# Patient Record
Sex: Female | Born: 1955 | Race: White | Hispanic: No | Marital: Married | Smoking: Never smoker
Health system: Southern US, Academic
[De-identification: ages and names within clinical notes are randomized; demographics above are authoritative.]

## PROBLEM LIST (undated history)

## (undated) DIAGNOSIS — E119 Type 2 diabetes mellitus without complications: Secondary | ICD-10-CM

## (undated) DIAGNOSIS — Z1371 Encounter for nonprocreative screening for genetic disease carrier status: Secondary | ICD-10-CM

## (undated) DIAGNOSIS — J449 Chronic obstructive pulmonary disease, unspecified: Secondary | ICD-10-CM

## (undated) DIAGNOSIS — M461 Sacroiliitis, not elsewhere classified: Secondary | ICD-10-CM

## (undated) DIAGNOSIS — G5700 Lesion of sciatic nerve, unspecified lower limb: Secondary | ICD-10-CM

## (undated) DIAGNOSIS — I10 Essential (primary) hypertension: Secondary | ICD-10-CM

## (undated) DIAGNOSIS — E039 Hypothyroidism, unspecified: Secondary | ICD-10-CM

## (undated) DIAGNOSIS — M1990 Unspecified osteoarthritis, unspecified site: Secondary | ICD-10-CM

## (undated) DIAGNOSIS — K760 Fatty (change of) liver, not elsewhere classified: Secondary | ICD-10-CM

## (undated) DIAGNOSIS — E785 Hyperlipidemia, unspecified: Secondary | ICD-10-CM

## (undated) HISTORY — DX: Type 2 diabetes mellitus without complications: E11.9

## (undated) HISTORY — DX: Unspecified osteoarthritis, unspecified site: M19.90

## (undated) HISTORY — DX: Essential (primary) hypertension: I10

## (undated) HISTORY — DX: Hypothyroidism, unspecified: E03.9

## (undated) HISTORY — PX: HX BREAST LUMPECTOMY: SHX2

## (undated) HISTORY — DX: Hyperlipidemia, unspecified: E78.5

## (undated) HISTORY — DX: Fatty (change of) liver, not elsewhere classified: K76.0

## (undated) HISTORY — DX: Chronic obstructive pulmonary disease, unspecified: J44.9

## (undated) HISTORY — PX: HX GASTRIC SLEEVE: 2100003104

---

## 1992-05-29 ENCOUNTER — Other Ambulatory Visit (HOSPITAL_COMMUNITY): Payer: Self-pay

## 2015-10-07 ENCOUNTER — Other Ambulatory Visit (HOSPITAL_COMMUNITY): Payer: Self-pay

## 2015-10-07 DIAGNOSIS — R413 Other amnesia: Secondary | ICD-10-CM

## 2015-11-07 ENCOUNTER — Encounter (HOSPITAL_COMMUNITY): Payer: Self-pay

## 2015-11-08 ENCOUNTER — Ambulatory Visit
Admission: RE | Admit: 2015-11-08 | Discharge: 2015-11-08 | Disposition: A | Discharge status: Home / Self Care | Payer: No Typology Code available for payment source | Source: Ambulatory Visit

## 2015-11-08 DIAGNOSIS — R41844 Frontal lobe and executive function deficit: Secondary | ICD-10-CM

## 2015-11-08 DIAGNOSIS — F329 Major depressive disorder, single episode, unspecified: Secondary | ICD-10-CM

## 2015-11-08 DIAGNOSIS — R413 Other amnesia: Secondary | ICD-10-CM | POA: Insufficient documentation

## 2015-11-08 DIAGNOSIS — F419 Anxiety disorder, unspecified: Secondary | ICD-10-CM

## 2015-11-11 NOTE — Progress Notes (Signed)
Department of Behavioral Medicine and Psychiatry  Outpatient Services  [place], [place] [address]      NEUROPSYCHOLOGICAL EVALUATION    PATIENT NAME: Jasmine Bates, ZAHLER  CHART NUMBER: [id number]  DATE OF BIRTH: 06-20-1956  DATE OF SERVICE: 11/08/2015    TIME IN AND OUT:  8:14 a.m./10:52 a.m.    CPT CODES:  [id number] = 3 hours (9:28 a.m./10:14 a.m. interview and evaluation, +2 hours record review, integration, report)  [phone] = 2 hours (Technician:  CRF, 8:14 a.m./9:58 a.m.; 10:14 a.m./10:52 a.m.)    November 08, 2015      Samella Parr, DO   [place], [place] [address]      Dear Dr. Nathaneil Canary:    As you requested, we saw this 60 year old, right-handed, Caucasian woman for a neuropsychological evaluation to clarify her current cognitive functioning.  The patient has noticed cognitive difficulties for at least 3 years that have been progressive in nature.  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  She notices some difficulties at home as well.  Her husband is helping her keep track of medical appointments and medications.      The patient reported some depression at this time that is complicated by grief of her father's recent passing.  She is being treated with Zoloft from her PCP.  An increased dosage has improved her sleep.  She denied suicidal ideation.  She reported longstanding anxiety with rare panic attacks in the past.  Psychological treatment history is fairly unremarkable.  Substance use history is unremarkable.      Medical history is remarkable for migraines, hypertension, hypothyroidism, high cholesterol, and arthritis.  She also has asthma and was intubated approximately 9 times, most recently 15 years ago.  Her asthma  improve with menopause.  She did not notice any longstanding changes with in her memory following any intubations.  Family medical history is remarkable for a sister with multiple sclerosis.  Her last brain MRI in 2012 showed scattered white matter hyperintensities comparable to the prior evaluations in 2010 and 2009.  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  She lives with her husband at this time.    BEHAVIORAL OBSERVATIONS:  The patient was pleasant and cooperative with the evaluation.  Affect was quite anxious.  She was a good historian.  She was noted to get quite flustered when she perceived that she was making mistakes.  Results are valid.    RESULTS:  Brief motor examination revealed intact strength of grip and no pronator drift.  Finger-to-nose was intact.  Rapid alternating hand and finger movements were intact.  Motor sequencing was intact.  Go/no-go was mildly stimulus bound and effortful bilaterally.  She had a mild glabellar tap reflex, but no grasp reflex.  There was utilization bilaterally, left greater than right, but no cogwheeling or rigidity.  Repetitive drawings were somewhat perseverative.  She had 1 bilateral tactile extinction with priming.  Left/right attention was intact.  Praxis was intact.  Visual fields were intact without extinctions.  Gaze was intact.  There was no hemispatial inattention with line bisection.  Basic visuospatial construction was intact.  Complex visuospatial construction was mildly disorganized but generally intact.  Block construction was low average.  Spontaneous speech was fluent and articulate without evidence of word-finding difficulties or paraphasic errors.  Complex comprehension was 3-step midline.  Repetition was intact.  Single word reading was average.  Phonemic fluency was mildly impaired as was semantic fluency.   Confrontational naming was intact.  General cognitive efficiency was mildly impaired and she had slightly more difficulty when behavioral inhibition was required.  Visual scanning and motor response speed had one error and overall speed was average.  When an element of cognitive flexibility was required, performance was average.  Basic auditory attention span was slightly variable and average overall.  Attention in the presence of interference was generally intact, although mildly perseverative.  Verbal learning through repetition revealed a minimally progressive and variable learning curve with low average recall following a delay with encoding and retrieval-based weaknesses.  Memory for logically organized paragraph length information was average.  Basic visuospatial memory was mildly impaired with encoding and retrieval-based deficits.  Verbal abstraction was average.  Visual problem solving was generally intact.  On several self-report questionnaires, she indicated a severe level of depression and anxiety.    IMPRESSIONS:  On this evaluation, the patient demonstrated mild frontal subcortical deficits including deficits in executive functioning (stimulus-bound behavior, perseverative, impulsive), processing speed, visuospatial construction, verbal fluency and encoding and retrieval-based memory.  Her memory benefits from structure and cues.      The patient is experiencing a severe level of depression and anxiety at this time that is likely significantly contributing to her cognitive deficits.  She would likely see improvement in her cognitive functioning with improvement in her depression and anxiety.  Therefore, consultation with a psychiatrist may be beneficial as well as engaging in talk therapy to increase coping.  The patient appears to get frustrated and flustered with mistakes, which likely leads to increased anxiety and more mistakes, creating a cycle that is difficult for her to break.  Talk therapy  could to be helpful in breaking this cycle and reducing automatic negative thoughts.      The patient also has a complex medical history involving asthma with intubation, cardiovascular risk factors and a history of migraines.  White matter changes are notable on her brain MRI.  It is possible that these white matter changes could be contributing to some cognitive inefficiencies as well.  She is experiencing chronic pain, which could also be contributory.  It may be beneficial to update brain imaging to see if white matter hyperintensities have increased over time.      In general, the patient would benefit from structure, routine, limiting multitasking, and allowing herself enough time to complete activities.      Thank you for requesting evaluation.  Do not hesitate to contact us if you require further assistance.    Sincerely,      Blenda Peals, PhD  Assistant Professor  [facility] Department of Behavioral Medicine and Psychiatry    [id number]; D: 11/08/2015 16:11:26; T: 11/11/2015 12:35:16    cc: Julieta Gutting DO      [address]       [place], [place] [address]

## 2015-11-19 ENCOUNTER — Telehealth (HOSPITAL_COMMUNITY): Payer: Self-pay | Admitting: Clinical Neuropsychologist

## 2015-11-19 NOTE — Progress Notes (Signed)
Pt called asking about results of eval.  Pt was receptive to impressions and recommendations.  She was encouraged to call back if she needed assistance finding a psychiatrist or talk therapist in her area.    Blenda Peals, PhD  Licensed Psychologist   Assistant Professor

## 2016-08-09 ENCOUNTER — Other Ambulatory Visit: Payer: Self-pay

## 2018-03-30 IMAGING — US ABD LIMITED
1 series · 14 of 25 positions shown · non-contrast
Comparison: 04/03/2015.

EXAM:  DEDRICK PROFESSIONAL READ ABD U/S LMTD
INDICATION: Fatty liver.

[Series 1: abd limited · 14 of 65 slices shown]
[im 1/65]
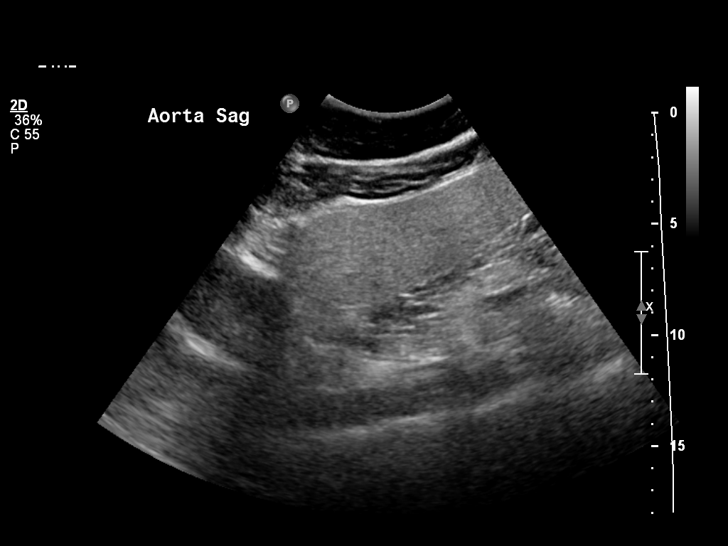
[im 6/65]
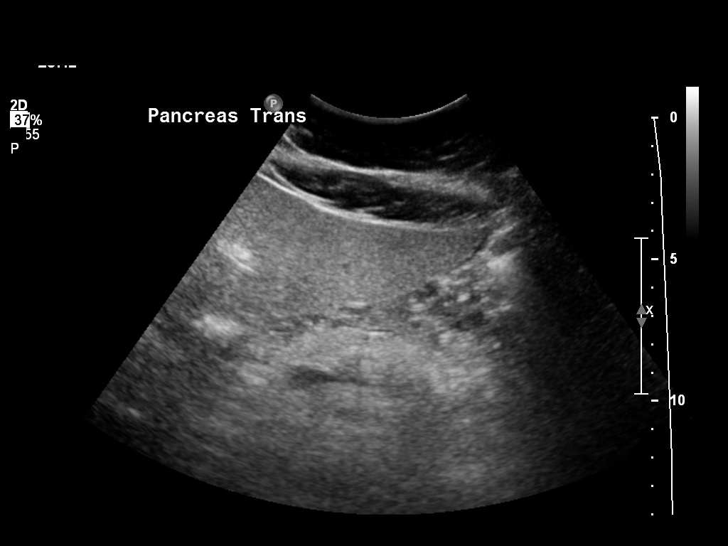
[im 11/65]
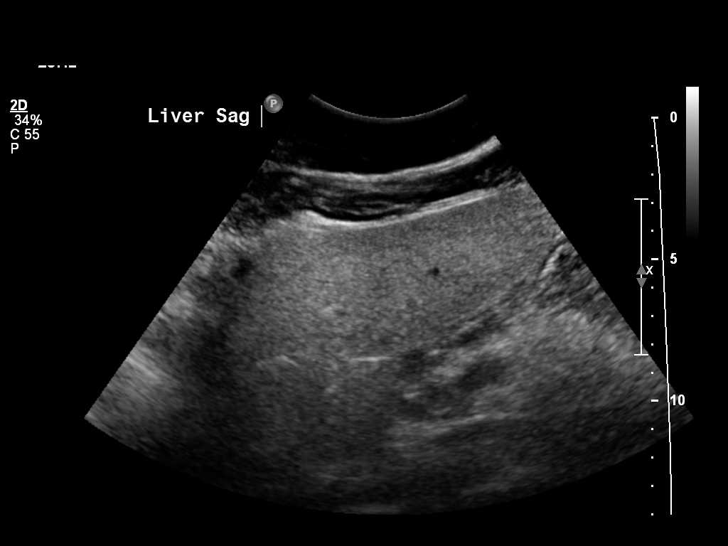
[im 17/65]
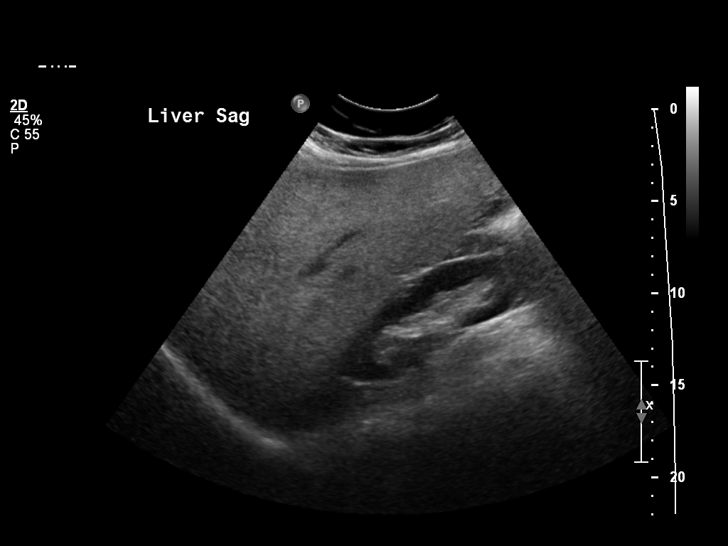
[im 22/65]
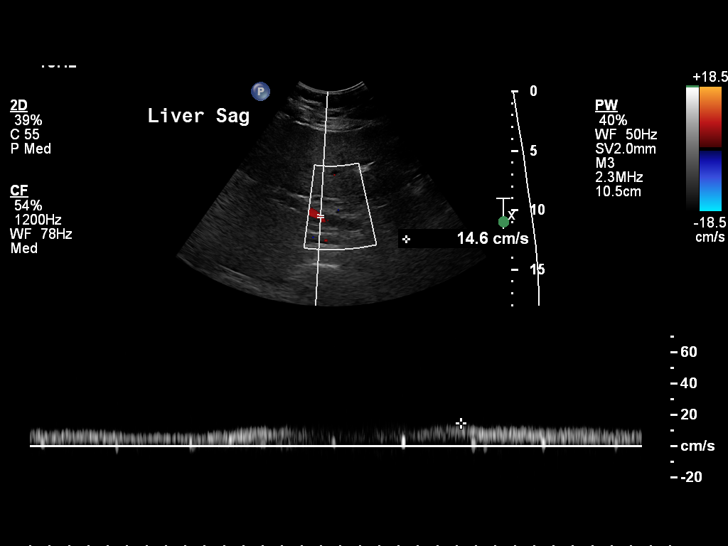
[im 25/65]
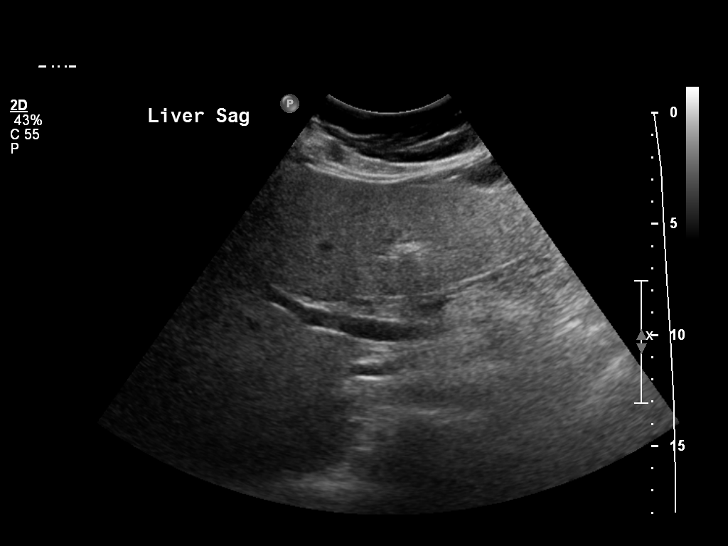
[im 30/65]
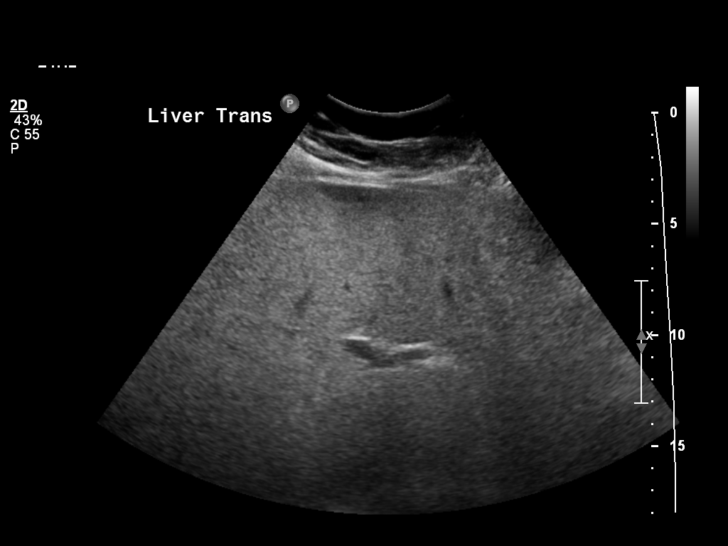
[im 35/65]
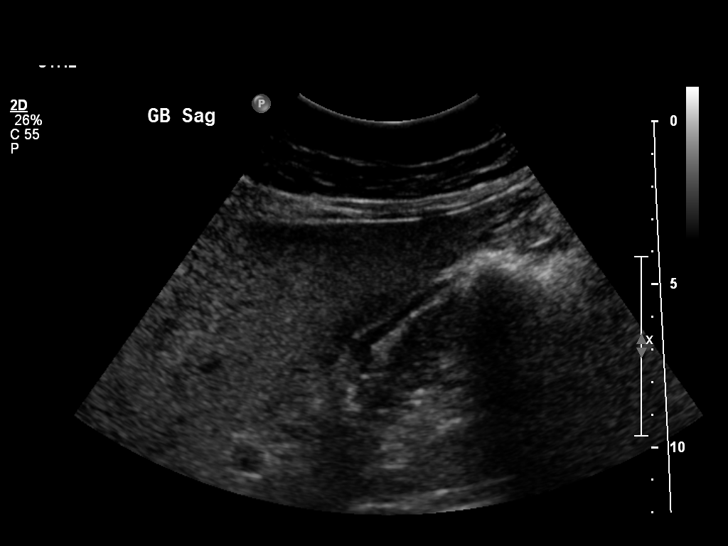
[im 41/65]
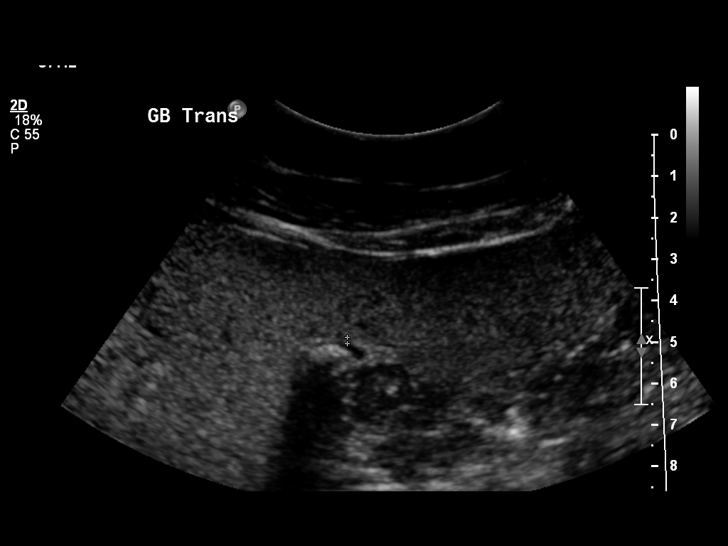
[im 43/65]
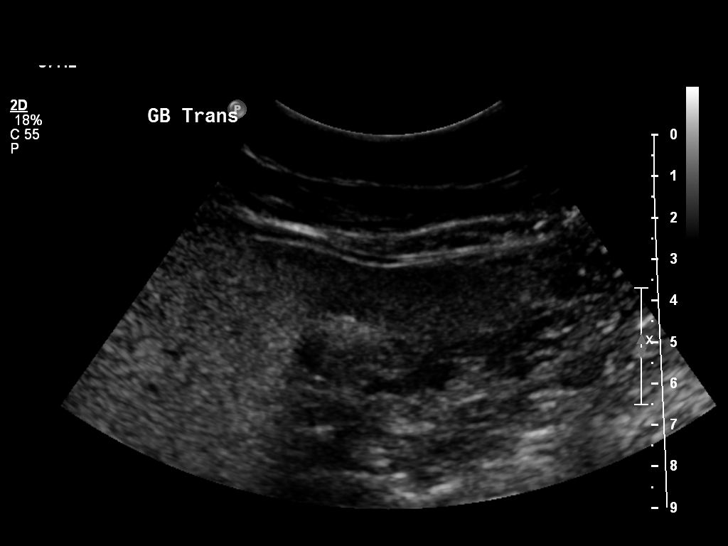
[im 49/65]
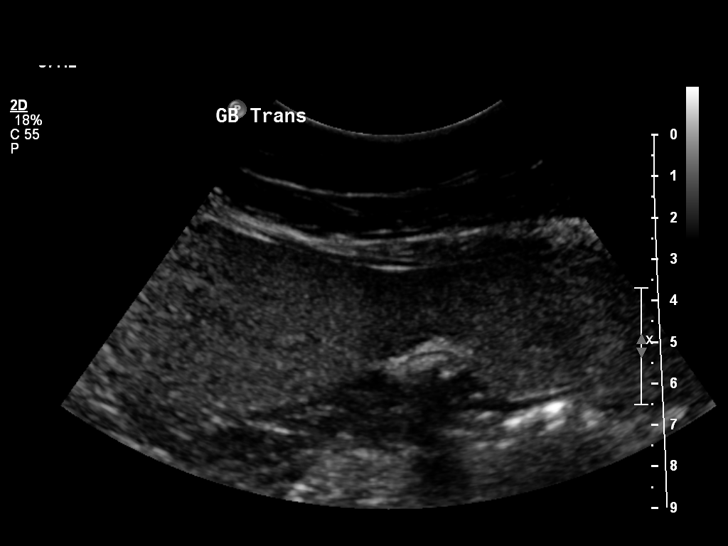
[im 54/65]
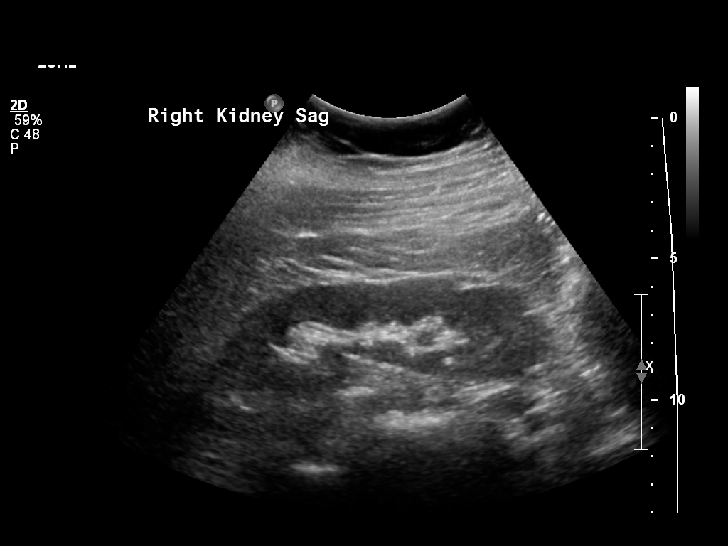
[im 59/65]
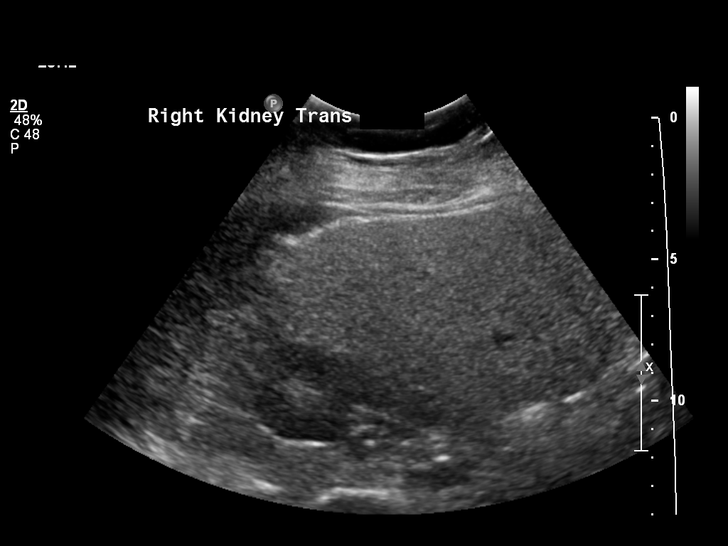
[im 65/65]
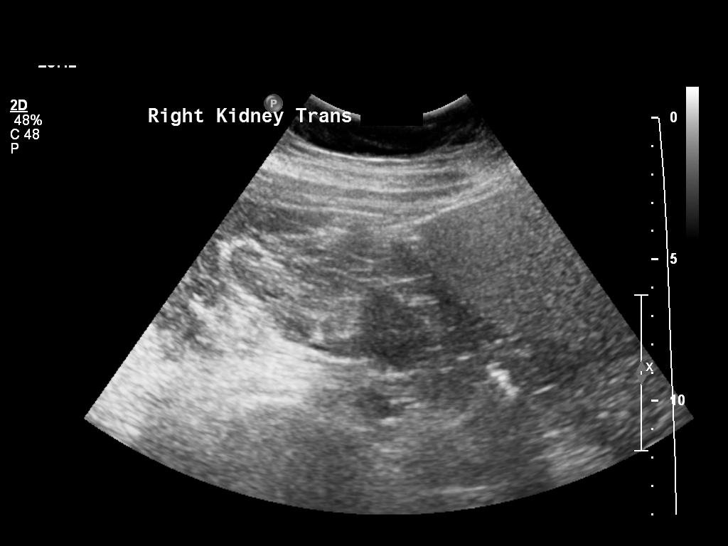

[14 of 25 positions shown; findings below may reference images not displayed]

FINDINGS: Liver is fatty and enlarged measuring 20.5 cm in maximum sagittal dimension. Fatty infiltration limits evaluation for focal hepatic mass. There is no intra or extrahepatic biliary ductal dilatation. Common bile duct measures 3.5 mm. Gallbladder is contracted. There is a shadowing calculus in the region of the gallbladder fundus. There is no pericholecystic fluid. Pancreas is incompletely visualized due to artifact from overlying bowel gas. Right kidney measures 12 cm and is normal.

Visualized abdominal aorta is without aneurysmal dilatation. IVC is normal. Portal vein measures 7 mm in diameter and demonstrates hepatopetal flow. Hepatic veins are also patent. There is no ascites.
IMPRESSION: 1. Fatty and enlarged liver. 

2. Cholelithiasis without sonographic evidence of acute cholecystitis. 

3. Pancreas incompletely visualized due to artifact from overlying bowel gas.

## 2018-08-03 DIAGNOSIS — C50919 Malignant neoplasm of unspecified site of unspecified female breast: Secondary | ICD-10-CM

## 2018-08-03 HISTORY — DX: Malignant neoplasm of unspecified site of unspecified female breast: C50.919

## 2019-06-04 DEATH — deceased

## 2020-05-06 IMAGING — US ABD LIMITED
1 series · 14 of 25 positions shown · non-contrast
Comparison: 03/30/2018.

﻿EXAM:  LOLI PROFESSIONAL READ ABD U/S LMTD
INDICATION: Fatty liver.

[Series 1: abd limited · 14 of 58 slices shown]
[im 1/58]
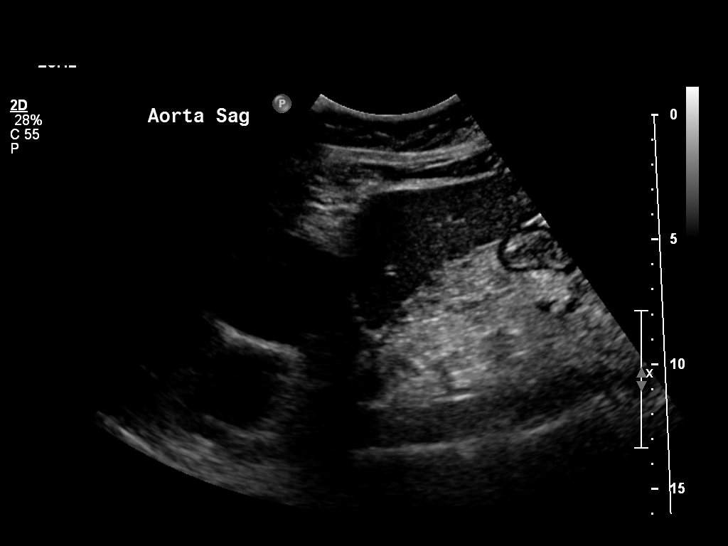
[im 5/58]
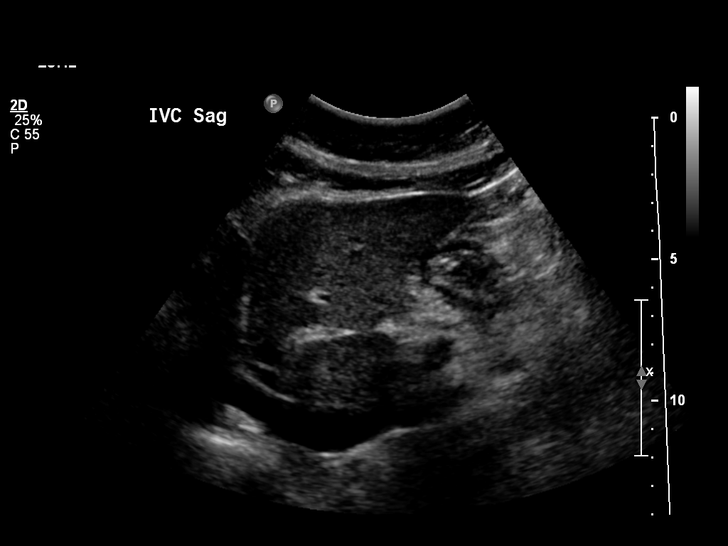
[im 10/58]
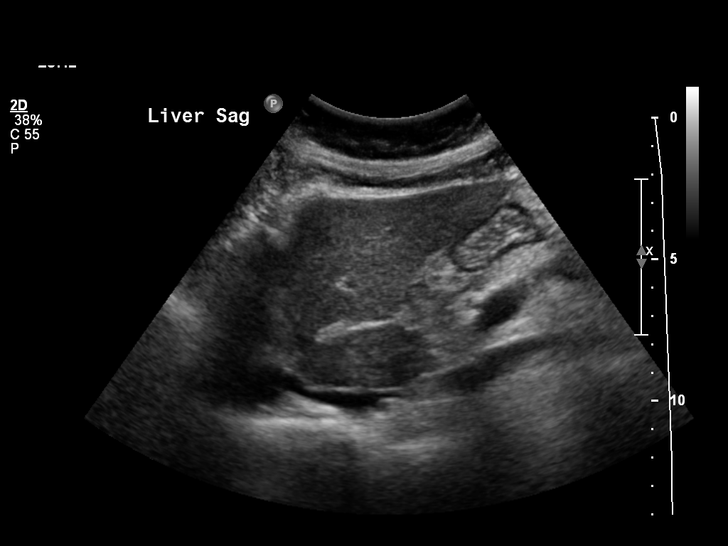
[im 15/58]
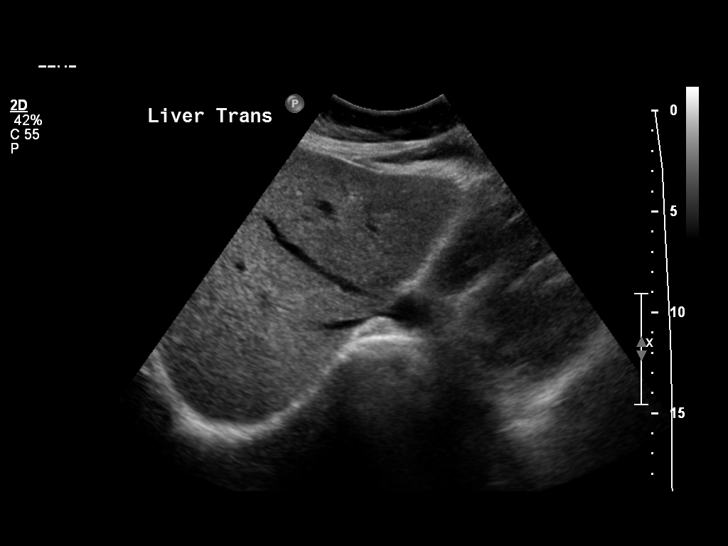
[im 20/58]
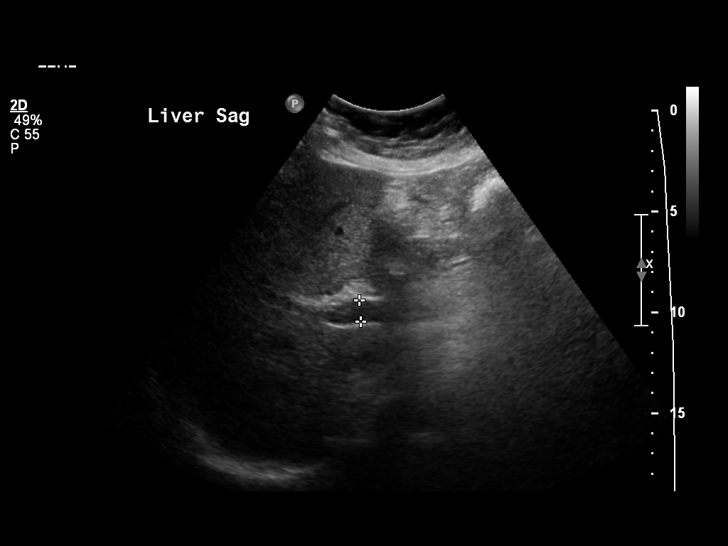
[im 22/58]
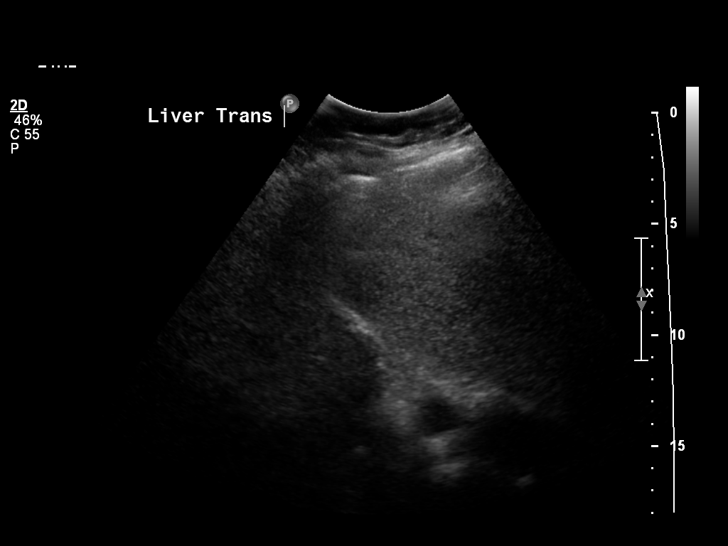
[im 27/58]
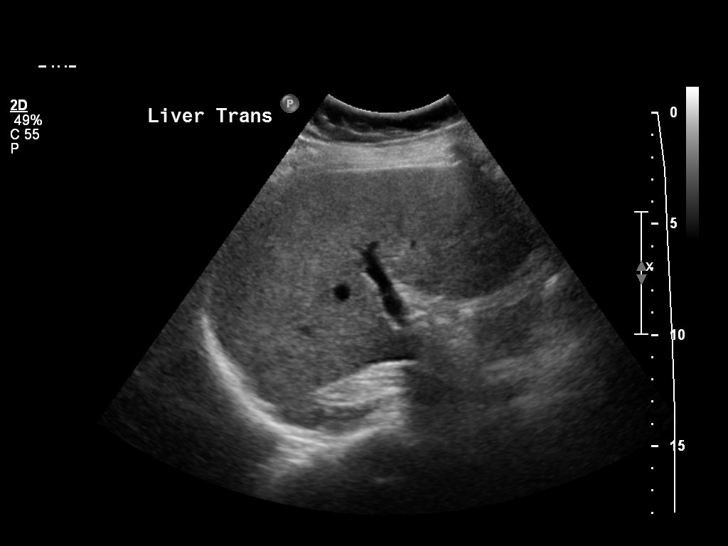
[im 31/58]
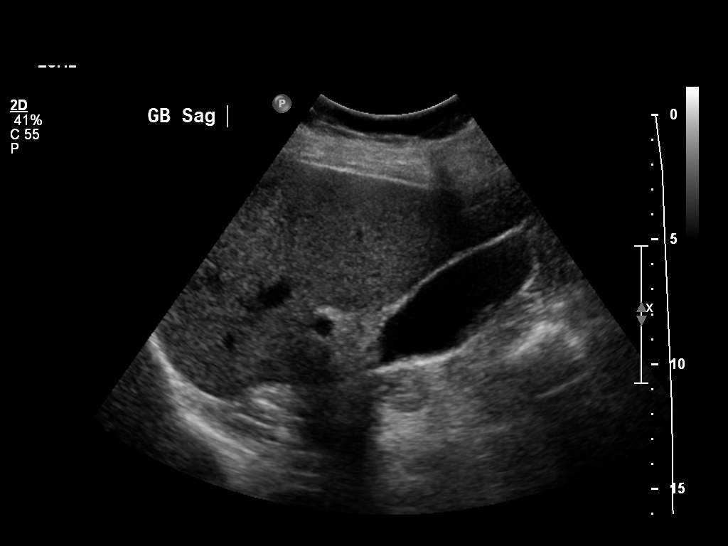
[im 36/58]
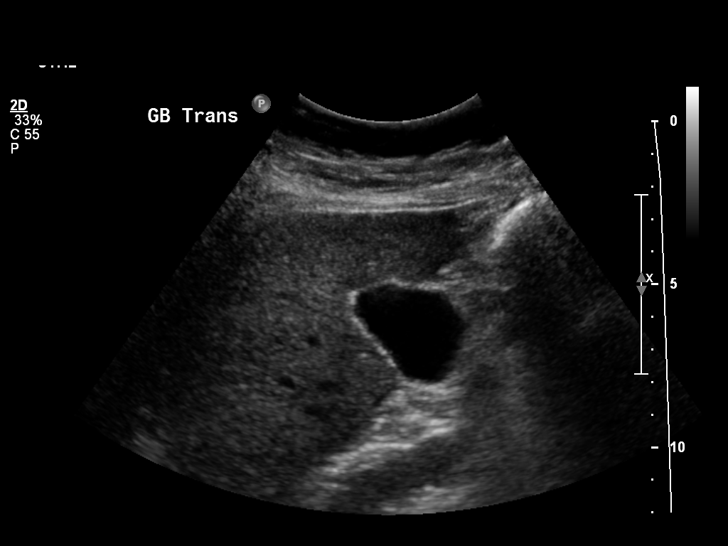
[im 39/58]
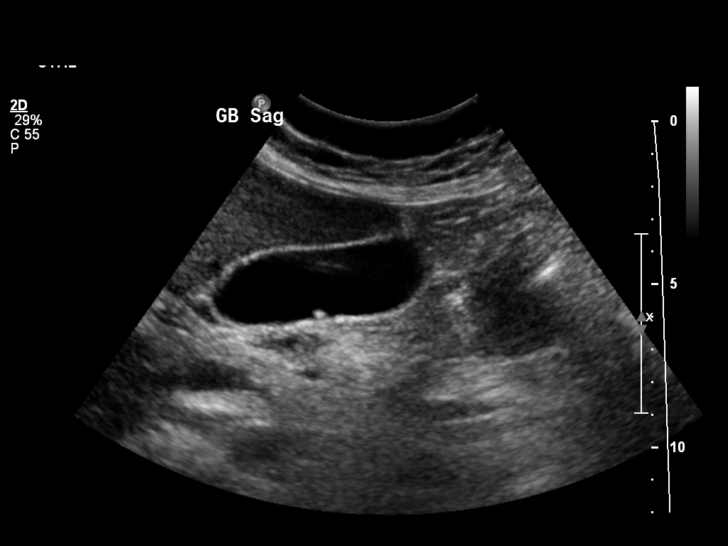
[im 43/58]
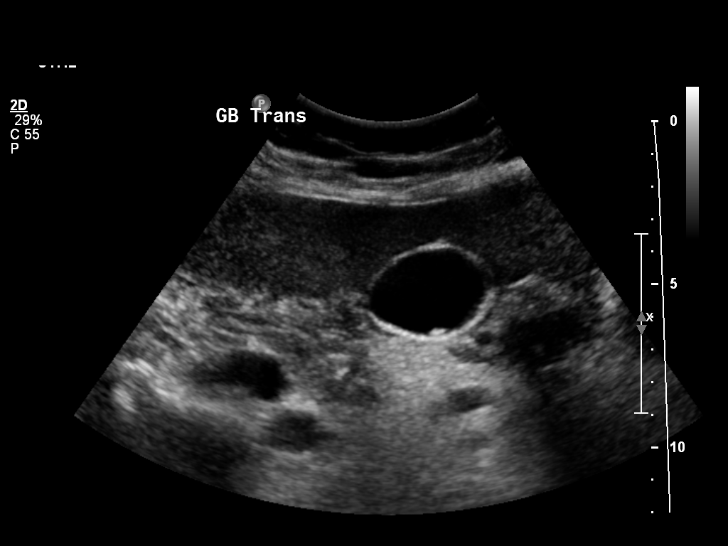
[im 48/58]
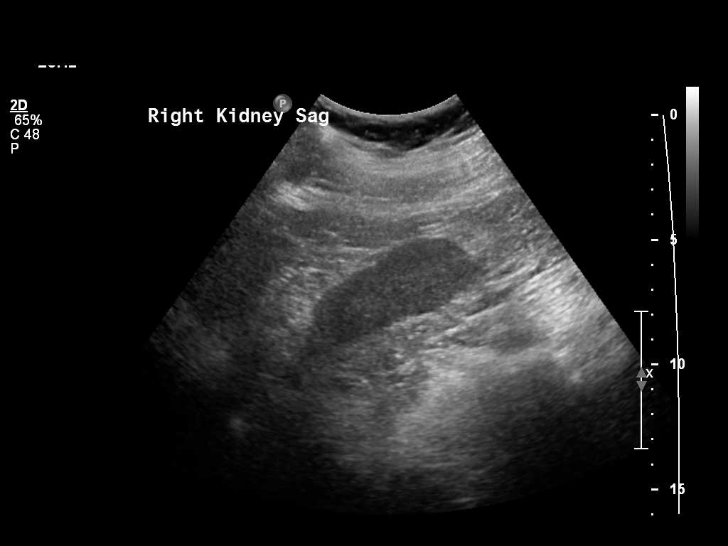
[im 53/58]
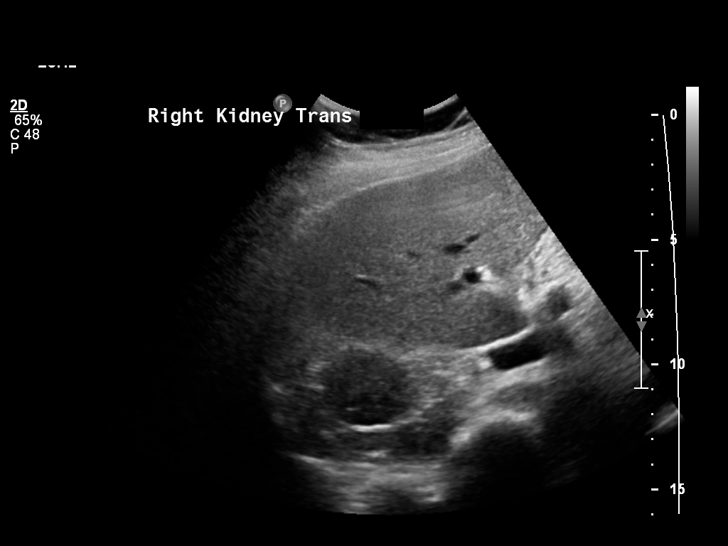
[im 58/58]
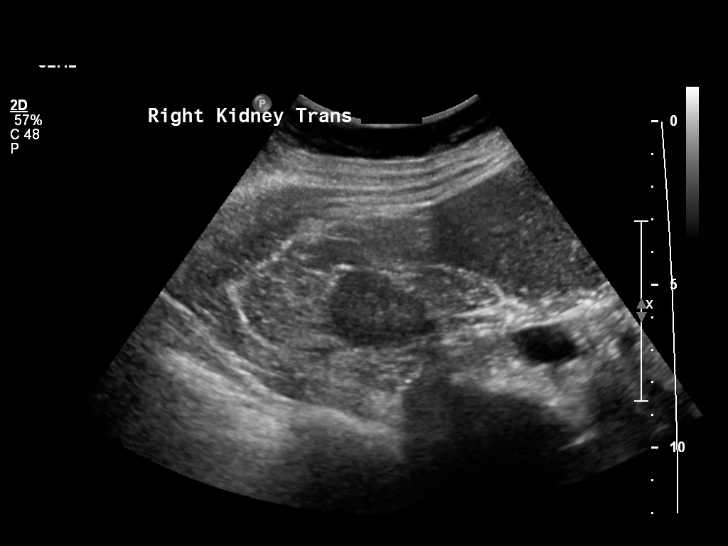

[14 of 25 positions shown; findings below may reference images not displayed]

FINDINGS: Liver is normal in echogenicity. There is no hepatic mass. There is no intra or extrahepatic biliary ductal dilatation. Common bile duct measures 4 mm. Small mobile gallstones are seen. There is no gallbladder wall thickening or pericholecystic fluid. Pancreas is incompletely visualized due to artifact from overlying bowel gas. Right kidney measures 12 cm and is normal.

Visualized abdominal aorta is without aneurysmal dilatation. IVC is normal. Portal vein measures 11 mm in diameter and demonstrates hepatopetal flow. Hepatic veins are also patent. There is no ascites.
IMPRESSION: 1. Unremarkable liver. 

2. Cholelithiasis without sonographic evidence of acute cholecystitis. 

3. Pancreas incompletely visualized due to artifact from overlying bowel gas.

## 2020-09-10 ENCOUNTER — Encounter (INDEPENDENT_AMBULATORY_CARE_PROVIDER_SITE_OTHER): Payer: Self-pay | Admitting: Hematology & Oncology

## 2020-09-12 ENCOUNTER — Ambulatory Visit (INDEPENDENT_AMBULATORY_CARE_PROVIDER_SITE_OTHER): Payer: Medicare Other | Admitting: Hematology & Oncology

## 2020-09-12 ENCOUNTER — Encounter (INDEPENDENT_AMBULATORY_CARE_PROVIDER_SITE_OTHER): Payer: Self-pay | Admitting: Hematology & Oncology

## 2020-09-12 ENCOUNTER — Other Ambulatory Visit: Payer: Self-pay

## 2020-09-12 VITALS — BP 135/70 | HR 80 | Temp 97.1°F | Ht 64.0 in | Wt 172.0 lb

## 2020-09-12 DIAGNOSIS — R109 Unspecified abdominal pain: Secondary | ICD-10-CM

## 2020-09-12 DIAGNOSIS — C50911 Malignant neoplasm of unspecified site of right female breast: Secondary | ICD-10-CM

## 2020-09-12 DIAGNOSIS — Z6829 Body mass index (BMI) 29.0-29.9, adult: Secondary | ICD-10-CM

## 2020-09-12 DIAGNOSIS — M5431 Sciatica, right side: Secondary | ICD-10-CM

## 2020-09-12 DIAGNOSIS — G8929 Other chronic pain: Secondary | ICD-10-CM

## 2020-09-12 MED ORDER — LETROZOLE 2.5 MG TABLET
2.5000 mg | ORAL_TABLET | Freq: Every day | ORAL | 3 refills | Status: DC
Start: 2020-09-12 — End: 2021-06-04

## 2020-09-12 NOTE — Progress Notes (Signed)
RECHECK, DOING WELL

## 2020-09-13 NOTE — Cancer Center Note (Addendum)
HEMATOLOGY/ONCOLOGY, [place], [place]   [place] [id number]  [facility]     Progress Note    Name: Jasmine Bates MRN:  [id number]   Date: 09/12/2020 DOB: 1956/04/03       Referring Physician: Shirlee More, MD  Primary Care Provider: Peri Maris, DO    Chief Complaint   Patient presents with   . Breast Cancer       Subjective:  Oncologic follow-up of patient with breast CA currently on Femara.  She is complaining of a right leg pain which is aggravated when she puts pressure on it or ambulates.  Otherwise no new complaints    ROS:     Review of Systems   Musculoskeletal: Positive for gait problem.   Neurological: Positive for gait problem.        Patient has a right lower extremity pain which is aggravated when she puts pressure on it.   All other systems reviewed and are negative.       Objective:   BP 135/70 (Site: Right, Patient Position: Sitting, Cuff Size: Adult)   Pulse 80   Temp 36.2 C (97.1 F) (Skin)   Ht 1.626 m (_0 )   Wt 78 kg (172 lb)   BMI 29.52 kg/m       Physical Exam  Constitutional:       Appearance: Normal appearance.   HENT:      Head: Normocephalic.      Nose: Nose normal.      Mouth/Throat:      Mouth: Mucous membranes are moist.   Eyes:      Pupils: Pupils are equal, round, and reactive to light.   Cardiovascular:      Rate and Rhythm: Normal rate and regular rhythm.      Pulses: Normal pulses.      Heart sounds: Normal heart sounds.   Pulmonary:      Breath sounds: Normal breath sounds.   Chest:      Comments: Right and left breast close to symmetrical area of lumpectomy inner upper quadrant right breast is well healed no masses or discharges bilaterally no adenopathy bilaterally  Abdominal:      General: Abdomen is flat. Bowel sounds are normal.      Palpations: Abdomen is soft.   Musculoskeletal:         General: Normal range of motion.      Comments: Patient has right lower back pain radiating to  the right leg when she ambulates.  She remarks that it is also worse when she sits and puts pressure on it.  No motor deficit but she has slight limp because of pain   Skin:     General: Skin is warm.   Neurological:      General: No focal deficit present.      Mental Status: She is alert.   Psychiatric:         Mood and Affect: Mood normal.          ECOG Status: 0 - Fully active, able to carry on all pre-disease performance without restriction.       Current Outpatient Medications   Medication Sig   . acarbose (PRECOSE) 25 mg Oral Tablet    . atorvastatin (LIPITOR) 10 mg Oral Tablet    . cetirizine (ZYRTEC) 10 mg Oral Tablet Take 10 mg by mouth Once a day   . ergocalciferol, vitamin D2, (DRISDOL) 1,250 mcg (50,000  unit) Oral Capsule    . glimepiride (AMARYL) 4 mg Oral Tablet    . letrozole [facility]) 2.5 mg Oral Tablet Take 1 Tablet (2.5 mg total) by mouth Once a day   . levothyroxine (SYNTHROID) 100 mcg Oral Tablet    . lisinopriL (PRINIVIL) 20 mg Oral Tablet    . montelukast (SINGULAIR) 10 mg Oral Tablet    . MYRBETRIQ 25 mg Oral Tablet Sustained Release 24 hr    . ondansetron (ZOFRAN ODT) 4 mg Oral Tablet, Rapid Dissolve    . pantoprazole (PROTONIX) 40 mg Oral Tablet, Delayed Release (E.C.)    . pioglitazone (ACTOS) 30 mg Oral Tablet    . RYBELSUS 7 mg Oral Tablet    . sertraline (ZOLOFT) 50 mg Oral Tablet    . SUMAtriptan (IMITREX) 50 mg Oral Tablet    . verapamiL (VERELAN PM) 300 mg Oral Capsule, 24hr ER Pellet CT      Allergies as of 09/12/2020 - Reviewed 09/12/2020   Allergen Reaction Noted   . Aspirin  09/10/2020   . Nsaids (non-steroidal anti-inflammatory drug)  09/10/2020   . Sulfa (sulfonamides)  09/10/2020       Labs:  White count of 5500 hemoglobin of 14.1 hematocrit 42.8 and platelet count of 232 1000 urinalysis that she yellow clear negative for glucose ketone blood .  Leukocyte esterase of 25    Patient has had a colonoscopy care of Dr. Keith Rake March 15, 2018 that showed poor prep normal visualized  mucosa redundant colon small internal hemorrhoids grade 1  Radiology:   Mammogram 10/30/2019 no suspicious interval change to suggest presence of malignancy    Chest x-ray negative    Assessment:     1. 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 Right leg pain probably sciatica    4. Diabetes mellitus longstanding    5. Hypertension    6. Fatty liver from diabetes and  history of obesity     7. History of obesity status post gastric bypass surgery     8. COPD     9. Hypothyroidism       Plan:     The patient is to continue on Femara.  She is also going to get lumbar spine x-ray pelvic x-ray right hip and a right femoral x-ray.  Her symptoms are suggestive of sciatica.  She would need to consult her PCP.    The patient was supposed to have a colonoscopy care Dr. Keith Rake.  Her last one on 2019 was suboptimal.  She was supposed to have had another 08/22/2018.      A total of 30 minutes was spent on this consultation with >50% spent in education, counseling of patient on diagnosis and treatment and in coordination of care.  All questions presented by patient on this visit were satisfactorily answered.      Return in about 3 months (around 12/10/2020).    Unice Cobble, MD      CC:  PCP General:  Peri Maris, DO  [place]  [place] [address]  Portions of this note may be dictated using voice recognition software or a dictation service. Variances in spelling and vocabulary are possible and unintentional. Not all errors are caught/corrected. Please notify the [facility] if any discrepancies are noted or  if the meaning of any statement is not clear.

## 2020-09-19 ENCOUNTER — Ambulatory Visit (INDEPENDENT_AMBULATORY_CARE_PROVIDER_SITE_OTHER): Payer: Self-pay | Admitting: Hematology & Oncology

## 2020-09-19 NOTE — Nursing Note (Signed)
I contacted pt to let her know that her labs and xray was good and that Dr. Josiah Lobo signed off on it. She asked what she should do about the burning sensation of the right outer thigh. Per Dr. Josiah Lobo she suggest the patient contact her PCP. Pt voiced understanding.

## 2020-09-19 NOTE — Telephone Encounter (Signed)
-----   Message from Center sent at 09/19/2020 11:54 AM EST -----  Please call pt at [phone] in regards to lab & x ray results.  Thank you

## 2020-10-29 ENCOUNTER — Telehealth (INDEPENDENT_AMBULATORY_CARE_PROVIDER_SITE_OTHER): Payer: Self-pay | Admitting: Hematology & Oncology

## 2020-12-12 ENCOUNTER — Encounter (INDEPENDENT_AMBULATORY_CARE_PROVIDER_SITE_OTHER): Payer: Self-pay | Admitting: Family

## 2020-12-20 ENCOUNTER — Other Ambulatory Visit: Payer: Self-pay

## 2021-06-02 NOTE — Cancer Center Note (Signed)
Jasmine Bates  [id number]  August 24, 1955   06/04/2021       Department of Hematology/Oncology  Return Patient Visit           REFERRING PROVIDER:  Shirlee More, MD  [address]  [place],  [place] [address]      REASON FOR OFFICE VISIT:  Ongoing management and evaluation of  Breast cancer       HISTORY OF PRESENT ILLNESS:  Jasmine Bates is a 65 y.o. female who presents to today alone for evaluation of Breast cancer.    Patient is currently on Femara.   She reports that she is having 20 + hot flashes a day.  She is also having issues with controlling her blood sugar and her endocrinologist wanted her to see if it was coming from the Letrozole.     She reports that she is falling a lot as well.  She believes it is due to weak ankles.  She did have testing completed including an MRI and it was negative for any brain lesions per patient report.  Denies fever, chills, diaphoresis, night sweats, and malaise. No infections during the interim.       REVIEW OF SYSTEMS:  Review of Systems   Constitutional: Negative.    HENT:  Negative.    Eyes: Negative.    Respiratory: Negative.    Cardiovascular: Negative.    Gastrointestinal: Negative.    Endocrine: Positive for hot flashes.   Genitourinary: Negative.     Musculoskeletal: Negative.    Skin: Negative.    Neurological: Negative.    Hematological: Negative.    Psychiatric/Behavioral: Negative.         Past Medical History:   Diagnosis Date   . COPD (chronic obstructive pulmonary disease) (CMS HCC)    . Diabetes mellitus, type 2 (CMS HCC)    . Essential hypertension    . Fatty liver    . Hyperlipidemia    . Hypothyroidism    . Osteoarthritis            Past Surgical History:   Procedure Laterality Date   . CESAREAN SECTION     . HX BREAST LUMPECTOMY Right    . HX GASTRIC SLEEVE             Social History     Socioeconomic History   . Marital status: Married     Spouse name: Not on file   . Number of children: Not on file   . Years of education: Not on file   . Highest education  level: Not on file   Occupational History   . Not on file   Tobacco Use   . Smoking status: Never   . Smokeless tobacco: Never   Vaping Use   . Vaping Use: Never used   Substance and Sexual Activity   . Alcohol use: Never   . Drug use: Never   . Sexual activity: Not on file   Other Topics Concern   . Not on file   Social History Narrative   . Not on file     Social Determinants of Health     Financial Resource Strain: Not on file   Food Insecurity: Not on file   Transportation Needs: Not on file   Physical Activity: Not on file   Stress: Not on file   Intimate Partner Violence: Not on file   Housing Stability: Not on file       Social History  Social History Narrative   . Not on file       Social History     Substance and Sexual Activity   Drug Use Never       Family Medical History:     Problem Relation (Age of Onset)    Hypertension (High Blood Pressure) Father    Lung Cancer Mother    Melanoma Mother            Current Outpatient Medications   Medication Sig   . acarbose (PRECOSE) 25 mg Oral Tablet    . anastrozole (ARIMIDEX) 1 mg Oral Tablet Take 1 Tablet (1 mg total) by mouth Once a day   . atorvastatin (LIPITOR) 10 mg Oral Tablet    . cetirizine (ZYRTEC) 10 mg Oral Tablet Take 10 mg by mouth Once a day   . ergocalciferol, vitamin D2, (DRISDOL) 1,250 mcg (50,000 unit) Oral Capsule    . glimepiride (AMARYL) 4 mg Oral Tablet    . levothyroxine (SYNTHROID) 100 mcg Oral Tablet    . lisinopriL (PRINIVIL) 20 mg Oral Tablet    . montelukast (SINGULAIR) 10 mg Oral Tablet    . MYRBETRIQ 25 mg Oral Tablet Sustained Release 24 hr    . ondansetron (ZOFRAN ODT) 4 mg Oral Tablet, Rapid Dissolve    . pantoprazole (PROTONIX) 40 mg Oral Tablet, Delayed Release (E.C.)    . pioglitazone (ACTOS) 30 mg Oral Tablet    . RYBELSUS 7 mg Oral Tablet    . sertraline (ZOLOFT) 50 mg Oral Tablet    . SUMAtriptan (IMITREX) 50 mg Oral Tablet    . verapamiL (VERELAN PM) 300 mg Oral Capsule, 24hr ER Pellet CT        Allergies   Allergen  Reactions   . Aspirin    . Nsaids (Non-Steroidal Anti-Inflammatory Drug)    . Sulfa (Sulfonamides)          PHYSICAL EXAM:  BP 136/71 (Site: Right, Patient Position: Sitting, Cuff Size: Adult)   Pulse 88   Temp 36.2 C (97.1 F) (Skin)   Ht 1.626 m ('5\' 4"' )   Wt 78.2 kg (172 lb 4.8 oz)   BMI 29.58 kg/m        ECOG Status: (0) Fully active, able to carry on all predisease performance without restriction   Physical Exam  Vitals reviewed.   Constitutional:       Appearance: Normal appearance. She is normal weight.   HENT:      Head: Normocephalic.   Eyes:      Extraocular Movements: Extraocular movements intact.      Conjunctiva/sclera: Conjunctivae normal.      Pupils: Pupils are equal, round, and reactive to light.   Neck:      Thyroid: No thyroid mass, thyromegaly or thyroid tenderness.   Cardiovascular:      Rate and Rhythm: Normal rate and regular rhythm.      Pulses: Normal pulses.      Heart sounds: Normal heart sounds, S1 normal and S2 normal. No murmur heard.    No S3 or S4 sounds.   Pulmonary:      Effort: Pulmonary effort is normal.      Breath sounds: Normal breath sounds.   Abdominal:      General: Bowel sounds are normal.      Palpations: Abdomen is soft.   Musculoskeletal:         General: Normal range of motion.      Cervical back: Normal range of  motion and neck supple.      Right lower leg: No edema.      Left lower leg: No edema.   Lymphadenopathy:      Head:      Right side of head: No submental, submandibular, preauricular, posterior auricular or occipital adenopathy.      Left side of head: No submental, submandibular, preauricular, posterior auricular or occipital adenopathy.      Cervical: No cervical adenopathy.      Right cervical: No superficial, deep or posterior cervical adenopathy.     Left cervical: No superficial, deep or posterior cervical adenopathy.      Upper Body:      Right upper body: No supraclavicular or axillary adenopathy.      Left upper body: No supraclavicular or  axillary adenopathy.      Lower Body: No right inguinal adenopathy. No left inguinal adenopathy.   Skin:     General: Skin is warm and dry.      Capillary Refill: Capillary refill takes less than 2 seconds.   Neurological:      General: No focal deficit present.      Mental Status: She is alert and oriented to person, place, and time. Mental status is at baseline.      Sensory: Sensation is intact.      Motor: Motor function is intact.      Coordination: Coordination is intact.      Gait: Gait is intact.   Psychiatric:         Attention and Perception: Attention and perception normal.         Mood and Affect: Mood and affect normal.         Speech: Speech normal.         Behavior: Behavior normal. Behavior is cooperative.         Thought Content: Thought content normal.         Cognition and Memory: Cognition and memory normal.         Judgment: Judgment normal.            DIAGNOSTIC DATA:  04/01/21 No acute CVA, mass or midline shift. Progressive atrophy of the brain consistent with patients age.    Head CT WO contrast     Mammogram 12/18/2020 no suspicious interval change to suggest presence of malignancy  09/12/20 pelvic x-ray no evidence of metastatic lesion  Chest x-ray negative    LABS:   09/12/20  WBC 5.5, Hgb 14.1, Hct 42.8, Plt 232,000  CA 27.29 15.1     ASSESSMENT:    ICD-10-CM    1. Breast cancer, right (CMS HCC)  C50.911 anastrozole (ARIMIDEX) 1 mg Oral Tablet         1. 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 Right leg pain probably sciatica    4. Diabetes mellitus longstanding    5.  Hypertension    6. Fatty liver from diabetes and  history of obesity     7. History of obesity status post gastric bypass surgery  8. COPD     9. Hypothyroidism     PLAN:   1.   Patient will stop her Femara for 2 weeks and then begin on Arimidex.  She is aware that this medication can cause hot flashes and joint pain as well.  She wishes to try a different medication.   She will return in 3 months sooner if needed.   Return in about 3 months (around 09/04/2021).     Jasmine Bates was given the chance to ask questions, and these were answered to their satisfaction. The patient is welcome to call with any questions or concerns in the meantime.     On the day of the encounter, I spent a total of  20 minutes on this patient encounter including review of historical information, examination, documentation and post-visit activities.     Patrcia Dolly, APRN,FNP-BC  06/02/2021, 16:11     This note was partially generated using MModal Fluency Direct system, and there may be some incorrect words, spellings, and punctuation that were not noted in checking the note before saving.     CC:  Peri Maris, DO  [place]  [place] [address]

## 2021-06-04 ENCOUNTER — Encounter (INDEPENDENT_AMBULATORY_CARE_PROVIDER_SITE_OTHER): Payer: Self-pay | Admitting: NURSE PRACTITIONER

## 2021-06-04 ENCOUNTER — Other Ambulatory Visit: Payer: Self-pay

## 2021-06-04 ENCOUNTER — Ambulatory Visit (INDEPENDENT_AMBULATORY_CARE_PROVIDER_SITE_OTHER): Payer: Medicare Other | Admitting: NURSE PRACTITIONER

## 2021-06-04 VITALS — BP 136/71 | HR 88 | Temp 97.1°F | Ht 64.0 in | Wt 172.3 lb

## 2021-06-04 DIAGNOSIS — C50911 Malignant neoplasm of unspecified site of right female breast: Secondary | ICD-10-CM

## 2021-06-04 DIAGNOSIS — Z6829 Body mass index (BMI) 29.0-29.9, adult: Secondary | ICD-10-CM

## 2021-06-04 MED ORDER — ANASTROZOLE 1 MG TABLET
1.0000 mg | ORAL_TABLET | Freq: Every day | ORAL | 2 refills | Status: DC
Start: 2021-06-04 — End: 2022-07-06

## 2021-06-04 NOTE — Nursing Note (Signed)
6 month f/u. Pt reports increase in hot flashes with femara. Also states glucose has been elevated, cannot get controlled, and wondering if femara is effecting this

## 2021-09-17 ENCOUNTER — Other Ambulatory Visit (HOSPITAL_COMMUNITY): Payer: Self-pay | Admitting: PHYSICIAN ASSISTANT

## 2021-09-17 DIAGNOSIS — N951 Menopausal and female climacteric states: Secondary | ICD-10-CM

## 2021-09-17 DIAGNOSIS — Z780 Asymptomatic menopausal state: Secondary | ICD-10-CM

## 2021-09-17 DIAGNOSIS — Z1231 Encounter for screening mammogram for malignant neoplasm of breast: Secondary | ICD-10-CM

## 2021-09-30 ENCOUNTER — Encounter (INDEPENDENT_AMBULATORY_CARE_PROVIDER_SITE_OTHER): Payer: Self-pay | Admitting: Family

## 2021-09-30 ENCOUNTER — Telehealth (INDEPENDENT_AMBULATORY_CARE_PROVIDER_SITE_OTHER): Payer: Self-pay | Admitting: Family

## 2021-09-30 NOTE — Telephone Encounter (Signed)
Called to inquire about missed appointment with Cranston Neighbor, NP today at [facility], no answer, left message with return phone number to reschedule appointment at her convenience. Letter with same instructions mailed to patient's home address.

## 2021-12-01 ENCOUNTER — Ambulatory Visit: Payer: Medicare Other | Attending: Family | Admitting: Family

## 2021-12-01 ENCOUNTER — Encounter (INDEPENDENT_AMBULATORY_CARE_PROVIDER_SITE_OTHER): Payer: Self-pay | Admitting: Family

## 2021-12-01 ENCOUNTER — Other Ambulatory Visit: Payer: Self-pay

## 2021-12-01 VITALS — BP 111/59 | HR 98 | Temp 97.3°F | Ht 64.0 in | Wt 154.0 lb

## 2021-12-01 DIAGNOSIS — Z923 Personal history of irradiation: Secondary | ICD-10-CM | POA: Insufficient documentation

## 2021-12-01 DIAGNOSIS — Z79811 Long term (current) use of aromatase inhibitors: Secondary | ICD-10-CM | POA: Insufficient documentation

## 2021-12-01 DIAGNOSIS — Z853 Personal history of malignant neoplasm of breast: Secondary | ICD-10-CM | POA: Insufficient documentation

## 2021-12-01 DIAGNOSIS — Z9884 Bariatric surgery status: Secondary | ICD-10-CM | POA: Insufficient documentation

## 2021-12-01 DIAGNOSIS — Z780 Asymptomatic menopausal state: Secondary | ICD-10-CM | POA: Insufficient documentation

## 2021-12-01 DIAGNOSIS — J449 Chronic obstructive pulmonary disease, unspecified: Secondary | ICD-10-CM | POA: Insufficient documentation

## 2021-12-01 DIAGNOSIS — Z86018 Personal history of other benign neoplasm: Secondary | ICD-10-CM | POA: Insufficient documentation

## 2021-12-01 DIAGNOSIS — Z808 Family history of malignant neoplasm of other organs or systems: Secondary | ICD-10-CM | POA: Insufficient documentation

## 2021-12-01 DIAGNOSIS — M79604 Pain in right leg: Secondary | ICD-10-CM | POA: Insufficient documentation

## 2021-12-01 DIAGNOSIS — E038 Other specified hypothyroidism: Secondary | ICD-10-CM

## 2021-12-01 DIAGNOSIS — I10 Essential (primary) hypertension: Secondary | ICD-10-CM | POA: Insufficient documentation

## 2021-12-01 DIAGNOSIS — E039 Hypothyroidism, unspecified: Secondary | ICD-10-CM | POA: Insufficient documentation

## 2021-12-01 DIAGNOSIS — Z801 Family history of malignant neoplasm of trachea, bronchus and lung: Secondary | ICD-10-CM | POA: Insufficient documentation

## 2021-12-01 DIAGNOSIS — K760 Fatty (change of) liver, not elsewhere classified: Secondary | ICD-10-CM | POA: Insufficient documentation

## 2021-12-01 DIAGNOSIS — C50919 Malignant neoplasm of unspecified site of unspecified female breast: Secondary | ICD-10-CM

## 2021-12-01 DIAGNOSIS — C50911 Malignant neoplasm of unspecified site of right female breast: Secondary | ICD-10-CM

## 2021-12-01 DIAGNOSIS — Z9889 Other specified postprocedural states: Secondary | ICD-10-CM | POA: Insufficient documentation

## 2021-12-01 DIAGNOSIS — Z08 Encounter for follow-up examination after completed treatment for malignant neoplasm: Secondary | ICD-10-CM | POA: Insufficient documentation

## 2021-12-01 DIAGNOSIS — E119 Type 2 diabetes mellitus without complications: Secondary | ICD-10-CM | POA: Insufficient documentation

## 2021-12-01 HISTORY — DX: Malignant neoplasm of unspecified site of unspecified female breast: C50.919

## 2021-12-01 NOTE — Cancer Center Note (Signed)
Department of Hematology/Oncology  Return Patient Visit       Jasmine Bates  [id number]  10-22-1955   12/01/2021      REASON FOR OFFICE VISIT:  Ongoing management and evaluation of  Breast cancer       HISTORY OF PRESENT ILLNESS:  Jasmine Bates is a 66 y.o. female who presents to today alone for evaluation of Breast cancer. Patient was switched to Arimidex at her last visit due to complaints of excessive hot flashes Patient states that switching from Femara to Arimidex did not decrease the hot flashes.  She states that she ran out of Arimidex and she not been taking anything for several weeks.  She requests to go back on Femara.  Denies fever, chills, diaphoresis, night sweats, and malaise. No infections during the interim.     REVIEW OF SYSTEMS:  Negative except for what is addressed in HPI.     Past Medical History:   Diagnosis Date   . COPD (chronic obstructive pulmonary disease) (CMS HCC)    . Diabetes mellitus, type 2 (CMS HCC)    . Essential hypertension    . Fatty liver    . Hyperlipidemia    . Hypothyroidism    . Osteoarthritis      Past Surgical History:   Procedure Laterality Date   . CESAREAN SECTION     . HX BREAST LUMPECTOMY Right    . HX GASTRIC SLEEVE       Social History     Socioeconomic History   . Marital status: Married     Spouse name: Not on file   . Number of children: Not on file   . Years of education: Not on file   . Highest education level: Not on file   Occupational History   . Not on file   Tobacco Use   . Smoking status: Never   . Smokeless tobacco: Never   Vaping Use   . Vaping Use: Never used   Substance and Sexual Activity   . Alcohol use: Never   . Drug use: Never   . Sexual activity: Not on file   Other Topics Concern   . Not on file   Social History Narrative   . Not on file     Social Determinants of Health     Financial Resource Strain: Not on file   Transportation Needs: Not on file   Social Connections: Not on file   Intimate Partner Violence: Not on file   Housing Stability:  Not on file     Social History     Social History Narrative   . Not on file     Social History     Substance and Sexual Activity   Drug Use Never     Family Medical History:     Problem Relation (Age of Onset)    Hypertension (High Blood Pressure) Father    Lung Cancer Mother    Melanoma Mother        Current Outpatient Medications   Medication Sig   . acarbose (PRECOSE) 25 mg Oral Tablet    . anastrozole (ARIMIDEX) 1 mg Oral Tablet Take 1 Tablet (1 mg total) by mouth Once a day   . atorvastatin (LIPITOR) 10 mg Oral Tablet    . cetirizine (ZYRTEC) 10 mg Oral Tablet Take 1 Tablet (10 mg total) by mouth Once a day   . ergocalciferol, vitamin D2, (DRISDOL) 1,250 mcg (50,000 unit) Oral Capsule    .  glimepiride (AMARYL) 4 mg Oral Tablet    . levothyroxine (SYNTHROID) 100 mcg Oral Tablet    . lisinopriL (PRINIVIL) 20 mg Oral Tablet    . montelukast (SINGULAIR) 10 mg Oral Tablet    . MYRBETRIQ 25 mg Oral Tablet Sustained Release 24 hr    . ondansetron (ZOFRAN ODT) 4 mg Oral Tablet, Rapid Dissolve    . pantoprazole (PROTONIX) 40 mg Oral Tablet, Delayed Release (E.C.)    . pioglitazone (ACTOS) 30 mg Oral Tablet    . RYBELSUS 7 mg Oral Tablet    . sertraline (ZOLOFT) 50 mg Oral Tablet    . SUMAtriptan (IMITREX) 50 mg Oral Tablet    . verapamiL (VERELAN PM) 300 mg Oral Capsule, 24hr ER Pellet CT      Allergies   Allergen Reactions   . Aspirin    . Nsaids (Non-Steroidal Anti-Inflammatory Drug)    . Sulfa (Sulfonamides)      VITAL SIGNS:  BP (!) 111/59 (Site: Left, Patient Position: Sitting, Cuff Size: Adult)   Pulse 98   Temp 36.3 C (97.3 F) (Thermal Scan)   Ht 1.626 m (5' 4")   Wt 69.9 kg (154 lb)   BMI 26.43 kg/m     ECOG Status: (0) Fully active, able to carry on all predisease performance without restriction.    Physical Exam  Vitals and nursing note reviewed.   Constitutional:       Appearance: Normal appearance.   HENT:      Head: Normocephalic and atraumatic.      Mouth/Throat:      Mouth: Mucous membranes are  moist.      Tongue: Tongue does not deviate from midline.   Eyes:      General: No scleral icterus.     Extraocular Movements: Extraocular movements intact.      Conjunctiva/sclera: Conjunctivae normal.      Pupils: Pupils are equal, round, and reactive to light.   Cardiovascular:      Rate and Rhythm: Normal rate and regular rhythm.      Heart sounds: Normal heart sounds, S1 normal and S2 normal.   Pulmonary:      Effort: Pulmonary effort is normal.      Breath sounds: Normal breath sounds.   Chest:      Chest wall: No mass or tenderness.   Breasts:     Right: Normal. No inverted nipple, mass, nipple discharge, skin change or tenderness.      Left: Normal. No inverted nipple, mass, nipple discharge, skin change or tenderness.   Abdominal:      General: Abdomen is flat. Bowel sounds are normal.      Palpations: Abdomen is soft.      Tenderness: There is no abdominal tenderness.   Musculoskeletal:         General: Normal range of motion.      Cervical back: Normal range of motion and neck supple. No bony tenderness.      Thoracic back: No bony tenderness.      Lumbar back: No bony tenderness.   Lymphadenopathy:      Cervical: No cervical adenopathy.      Upper Body:      Right upper body: No supraclavicular or axillary adenopathy.      Left upper body: No supraclavicular or axillary adenopathy.   Skin:     General: Skin is warm and dry.   Neurological:      General: No focal deficit present.  Mental Status: She is alert and oriented to person, place, and time.      Sensory: Sensation is intact.      Motor: Motor function is intact.      Coordination: Coordination is intact.      Gait: Gait is intact.   Psychiatric:         Attention and Perception: Attention and perception normal.         Mood and Affect: Mood and affect normal.         Speech: Speech normal.         Behavior: Behavior normal. Behavior is cooperative.         Thought Content: Thought content normal.         Cognition and Memory: Cognition and  memory normal.         Judgment: Judgment normal.      DIAGNOSTIC DATA:    Labs from 09-12-20: WBC 5.5, Hgb 14.1, Hct 42.8, plt 232 K, LFTs within normal limits, CA 27.29:  15.1      ASSESSMENT:    ICD-10-CM    1. Malignant neoplasm of breast (CMS HCC)  C50.919 CBC/DIFF     CA 27,29     HEPATIC FUNCTION PANEL         1. 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 Been on Arimidex for the past 6 months, but requests to return to Femara    2. 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 Right leg pain probably sciatica    4. Diabetes mellitus longstanding    5. Hypertension    6. Fatty liver from diabetes and  history of obesity     7. History of obesity status post gastric bypass surgery     8. COPD     9. Hypothyroidism     PLAN:   1.   Patient will stop Arimidex and restart Femara. She will obtain the above listed labs. She states that she is already scheduled for her next mammogram on 12-22-21.    Return in about 6 months (around 06/03/2022) for In Person Visit.     Jasmine Bates was given the chance to ask questions, and these were answered to their satisfaction. The patient is welcome to call with any questions or concerns in the meantime.       Ivonna K[employer] engineer, FNP-C      This note was partially generated using MModal Fluency Direct system, and there may be some incorrect words, spellings, and punctuation that were not noted in checking the note before saving.     CC:  Peri Maris, DO  [place]  [place] [id number]

## 2021-12-10 ENCOUNTER — Other Ambulatory Visit (HOSPITAL_COMMUNITY): Payer: Self-pay | Admitting: Family Medicine

## 2021-12-10 DIAGNOSIS — E039 Hypothyroidism, unspecified: Secondary | ICD-10-CM

## 2021-12-22 ENCOUNTER — Encounter (HOSPITAL_COMMUNITY): Payer: Self-pay

## 2021-12-22 ENCOUNTER — Inpatient Hospital Stay
Admission: RE | Admit: 2021-12-22 | Discharge: 2021-12-22 | Disposition: A | Discharge status: Home / Self Care | Payer: Medicare Other | Source: Ambulatory Visit | Attending: PHYSICIAN ASSISTANT | Admitting: PHYSICIAN ASSISTANT

## 2021-12-22 ENCOUNTER — Other Ambulatory Visit (HOSPITAL_COMMUNITY): Payer: Medicare Other

## 2021-12-22 ENCOUNTER — Other Ambulatory Visit: Payer: Self-pay

## 2021-12-22 ENCOUNTER — Inpatient Hospital Stay (HOSPITAL_BASED_OUTPATIENT_CLINIC_OR_DEPARTMENT_OTHER)
Admission: RE | Admit: 2021-12-22 | Discharge: 2021-12-22 | Disposition: A | Discharge status: Home / Self Care | Payer: Medicare Other | Source: Ambulatory Visit | Attending: PHYSICIAN ASSISTANT | Admitting: PHYSICIAN ASSISTANT

## 2021-12-22 DIAGNOSIS — M8588 Other specified disorders of bone density and structure, other site: Secondary | ICD-10-CM

## 2021-12-22 DIAGNOSIS — Z780 Asymptomatic menopausal state: Secondary | ICD-10-CM | POA: Insufficient documentation

## 2021-12-22 DIAGNOSIS — C50919 Malignant neoplasm of unspecified site of unspecified female breast: Secondary | ICD-10-CM | POA: Insufficient documentation

## 2021-12-22 DIAGNOSIS — Z1231 Encounter for screening mammogram for malignant neoplasm of breast: Secondary | ICD-10-CM | POA: Insufficient documentation

## 2021-12-22 HISTORY — DX: Encounter for nonprocreative screening for genetic disease carrier status: Z13.71

## 2021-12-22 LAB — CBC WITH DIFF
BASOPHIL #: 0.1 10*3/uL (ref 0.00–0.30)
BASOPHIL %: 1 % (ref 0–3)
EOSINOPHIL #: 0.9 10*3/uL — ABNORMAL HIGH (ref 0.00–0.80)
EOSINOPHIL %: 9 % — ABNORMAL HIGH (ref 0–7)
HCT: 41.2 % (ref 37.0–47.0)
HGB: 13.8 g/dL (ref 12.5–16.0)
LYMPHOCYTE #: 2.5 10*3/uL (ref 1.10–5.00)
LYMPHOCYTE %: 24 % — ABNORMAL LOW (ref 25–45)
MCH: 29.2 pg (ref 27.0–32.0)
MCHC: 33.6 g/dL (ref 32.0–36.0)
MCV: 86.9 fL (ref 78.0–99.0)
MONOCYTE #: 0.9 10*3/uL (ref 0.00–1.30)
MONOCYTE %: 8 % (ref 0–12)
MPV: 9.3 fL (ref 7.4–10.4)
NEUTROPHIL #: 6.2 10*3/uL (ref 1.80–8.40)
NEUTROPHIL %: 58 % (ref 40–76)
PLATELETS: 236 10*3/uL (ref 140–440)
RBC: 4.74 10*6/uL (ref 4.20–5.40)
RDW: 15.2 % — ABNORMAL HIGH (ref 11.6–14.8)
WBC: 10.6 10*3/uL — ABNORMAL HIGH (ref 4.0–10.5)
WBCS UNCORRECTED: 10.6 10*3/uL

## 2021-12-22 LAB — HEPATIC FUNCTION PANEL
ALBUMIN/GLOBULIN RATIO: 1.2 (ref 0.8–1.4)
ALBUMIN: 4.5 g/dL (ref 3.5–5.7)
ALKALINE PHOSPHATASE: 74 U/L (ref 34–104)
ALT (SGPT): 15 U/L (ref 7–52)
AST (SGOT): 13 U/L (ref 13–39)
BILIRUBIN DIRECT: 0.07 md/dL (ref <=0.20)
BILIRUBIN TOTAL: 0.5 mg/dL (ref 0.3–1.2)
BILIRUBIN, INDIRECT: 0.43 mg/dL (ref <=1)
GLOBULIN: 3.9 (ref 2.9–5.4)
PROTEIN TOTAL: 8.4 g/dL (ref 6.4–8.9)

## 2021-12-26 LAB — CA 27,29: CA 27.29: 14 U/mL (ref <38)

## 2022-02-01 ENCOUNTER — Encounter (HOSPITAL_COMMUNITY): Payer: Self-pay | Admitting: Emergency Medicine

## 2022-02-01 ENCOUNTER — Other Ambulatory Visit: Payer: Self-pay

## 2022-02-01 ENCOUNTER — Emergency Department
Admission: EM | Admit: 2022-02-01 | Discharge: 2022-02-01 | Disposition: A | Discharge status: Home / Self Care | Payer: Medicare Other | Attending: Emergency Medicine | Admitting: Emergency Medicine

## 2022-02-01 DIAGNOSIS — G5702 Lesion of sciatic nerve, left lower limb: Secondary | ICD-10-CM | POA: Insufficient documentation

## 2022-02-01 DIAGNOSIS — M5432 Sciatica, left side: Secondary | ICD-10-CM

## 2022-02-01 DIAGNOSIS — M5442 Lumbago with sciatica, left side: Secondary | ICD-10-CM | POA: Insufficient documentation

## 2022-02-01 MED ORDER — MEPERIDINE (PF) 25 MG/ML INJECTION SOLUTION
INTRAMUSCULAR | Status: AC
Start: 2022-02-01 — End: 2022-02-01
  Filled 2022-02-01: qty 2

## 2022-02-01 MED ORDER — PROMETHAZINE 25 MG TABLET
ORAL_TABLET | ORAL | Status: AC
Start: 2022-02-01 — End: 2022-02-01
  Filled 2022-02-01: qty 1

## 2022-02-01 MED ORDER — PREDNISONE 20 MG TABLET
20.0000 mg | ORAL_TABLET | Freq: Three times a day (TID) | ORAL | 0 refills | Status: AC
Start: 2022-02-01 — End: 2022-02-06

## 2022-02-01 MED ORDER — PROMETHAZINE 25 MG TABLET
25.0000 mg | ORAL_TABLET | ORAL | Status: AC
Start: 2022-02-01 — End: 2022-02-01
  Administered 2022-02-01: 25 mg via ORAL

## 2022-02-01 MED ORDER — MEPERIDINE (PF) 25 MG/ML INJECTION SOLUTION
50.0000 mg | Freq: Once | INTRAMUSCULAR | Status: AC
Start: 2022-02-01 — End: 2022-02-01
  Administered 2022-02-01: 50 mg via INTRAMUSCULAR

## 2022-02-01 NOTE — ED Triage Notes (Signed)
Pt complains of left leg pain for 2.5 weeks getting progressively worse tonight. Seen by her chiropractor 5 times in that span and has also seen her PCP who gave her a steroid shot. Has sciatica in right leg.

## 2022-02-01 NOTE — ED Nurses Note (Signed)
Patient discharged home with husband.  AVS reviewed with patient/care giver.  A written copy of the AVS and discharge instructions was given to the patient/care giver.  Questions sufficiently answered as needed.  Patient/care giver encouraged to follow up with PCP as indicated.  In the event of an emergency, patient/care giver instructed to call 911 or go to the nearest emergency room.

## 2022-02-01 NOTE — Discharge Instructions (Signed)
Take prednisone 20 mg 3 times a day for the next 5 days; please take the prednisone with food.  Follow-up with family doctor for recheck in 3-4 days  If symptoms continue you may need to have an MRI or referral for physical therapy.  Return to emergency room for any increased pain, numbness/tingling, or any concerns

## 2022-02-01 NOTE — ED Provider Notes (Signed)
[facility]  ED Primary Provider Note  History of Present Illness   chief complaint    Arrival: The patient arrived by Car         Jasmine Bates is a 66 y.o. female who had concerns including Leg Pain.   Patient 66 year old female presents to the emergency room with complaint "sciatic pain" x2 and half weeks.  Patient states she is had pain from the left buttock radiating down the left leg for 2-1/2 weeks.  She is been to her chiropractor multiple times and her primary care provider has "give me a steroid shot".  She states that she is allergic to NSAIDs.  She is been taking Tylenol without relief.  She has no numbness or tingling.  She has full range of motion.  Pain is reproduced with palpation over the left piriformis  All nursing notes reviewed      Review of Systems     No other overt Review of Systems are noted to be positive except noted in the HPI.      Historical Data   History Reviewed This Encounter: Medical History  Surgical History  Family History  Social History        Physical Exam   ED Triage Vitals [02/01/22 0317]   BP (Non-Invasive) 97/68   Heart Rate 88   Respiratory Rate 20   Temperature 36.3 C (97.4 F)   SpO2 97 %   Weight    Height          Exam:   Constitutional:  Patient alert orient x3 in no apparent distress.  No limitations.  Head: Atraumatic normocephalic  Eyes :  Pupils are equal round reactive to light and accommodation extraocular muscles are intact.  Sclera and conjunctiva are unremarkable  Ears:  Tympanic membranes are pearly gray bilaterally; external auditory canals are unremarkable; external ears without any lesions  Nose:  Nares are patent turbinates are pink and moist  Mouth:  Mucosa is pink and moist without lesions.  Posterior pharynx is pink and moist without hypertrophy/exudate.  Neck:  Soft and supple without palpable lymphadenopathy.  Heart:  Regular rate and rhythm without audible murmur  Lungs:  Clear to auscultation bilaterally  without any wheezing/rales/rhonchi  Abdomen:  Soft nontender without any rebound or guarding; positive bowel sounds throughout  Genitalia:  Deferred  Skin:  Warm and dry without lesions.  Normal skin turgor.  Brisk capillary refill distally  Extremities:  Patient is tender to palpation over left buttock/piriformis area.  She has full range of motion.  Neuro:  Alert oriented x3.  Cranial nerves II-XII grossly intact as tested.  Excellent sensation distally over all dermatomes.  Psychiatric:  Patient cooperative, affect appropriate, insight and judgment good        Procedures      Patient Data   Labs Ordered/Reviewed - No data to display    No orders to display       Medical Decision Making          Medical Decision Making  Patient is tender to palpation over the left piriformis area.  She was given Demerol 50 mg IM with promethazine 25 mg p.o..  Will discharge her home on prednisone 20 mg 3 times a day for the next 5 days.  See discharge instructions for detailed.  No imaging warranted at this time.    Critical Care  Total time providing critical care: 0 minutes  Medications Administered in the ED   meperidine (DEMEROL) 25 mg/mL injection (50 mg IntraMUSCULAR Given 02/01/22 0410)   promethazine (PHENERGAN) tablet (25 mg Oral Given 02/01/22 0411)       Following the history, physical exam, and ED workup, the patient was deemed stable and suitable for discharge. The patient/caregiver was advised to return to the ED for any new or worsening symptoms. Discharge medications, and follow-up instructions were discussed with the patient/caregiver in detail, who verbalizes understanding. The patient/caregiver is in agreement and is comfortable with the plan of care.    Disposition: Discharged         Current Discharge Medication List      START taking these medications.      Details   predniSONE 20 mg Tablet  Commonly known as: DELTASONE   20 mg, Oral, 3 TIMES DAILY  Qty: 15 Tablet  Refills: 0        CONTINUE these  medications - NO CHANGES were made during your visit.      Details   acarbose 25 mg Tablet  Commonly known as: PRECOSE   No dose, route, or frequency recorded.  Refills: 0     anastrozole 1 mg Tablet  Commonly known as: ARIMIDEX   1 mg, Oral, DAILY  Qty: 30 Tablet  Refills: 2     atorvastatin 10 mg Tablet  Commonly known as: LIPITOR   No dose, route, or frequency recorded.  Refills: 0     cetirizine 10 mg Tablet  Commonly known as: ZYRTEC   10 mg, Oral, DAILY  Refills: 0     ergocalciferol (vitamin D2) 1,250 mcg (50,000 unit) Capsule  Commonly known as: DRISDOL   No dose, route, or frequency recorded.  Refills: 0     glimepiride 4 mg Tablet  Commonly known as: AMARYL   No dose, route, or frequency recorded.  Refills: 0     levothyroxine 100 mcg Tablet  Commonly known as: SYNTHROID   No dose, route, or frequency recorded.  Refills: 0     lisinopriL 20 mg Tablet  Commonly known as: PRINIVIL   No dose, route, or frequency recorded.  Refills: 0     montelukast 10 mg Tablet  Commonly known as: SINGULAIR   No dose, route, or frequency recorded.  Refills: 0     Myrbetriq 25 mg Tablet Sustained Release 24 hr  Generic drug: mirabegron   No dose, route, or frequency recorded.  Refills: 0     ondansetron 4 mg Tablet, Rapid Dissolve  Commonly known as: ZOFRAN ODT   No dose, route, or frequency recorded.  Refills: 0     pantoprazole 40 mg Tablet, Delayed Release (E.C.)  Commonly known as: PROTONIX   No dose, route, or frequency recorded.  Refills: 0     pioglitazone 30 mg Tablet  Commonly known as: ACTOS   No dose, route, or frequency recorded.  Refills: 0     Rybelsus 7 mg Tablet  Generic drug: semaglutide   No dose, route, or frequency recorded.  Refills: 0     sertraline 50 mg Tablet  Commonly known as: ZOLOFT   No dose, route, or frequency recorded.  Refills: 0     SUMAtriptan 50 mg Tablet  Commonly known as: IMITREX   No dose, route, or frequency recorded.  Refills: 0     verapamiL 300 mg Capsule, 24hr ER Pellet  CT  Commonly known as: VERELAN PM   No dose, route, or frequency recorded.  Refills: 0          Follow up:   Peri Maris, DO  [address]  [place] [address]  [phone]    In 3 days  Follow-up with family doctor for recheck in 3-4 days                 Clinical Impression   Sciatica of left side (Primary)   Piriformis syndrome of left side         Current Discharge Medication List      START taking these medications    Details   predniSONE (DELTASONE) 20 mg Oral Tablet Take 1 Tablet (20 mg total) by mouth Three times a day for 5 days  Qty: 15 Tablet, Refills: 0             R.A. Baldwin [place], DO  Department of Emergency Medicine

## 2022-02-05 ENCOUNTER — Ambulatory Visit (HOSPITAL_COMMUNITY): Payer: Self-pay

## 2022-02-13 ENCOUNTER — Other Ambulatory Visit: Payer: Self-pay

## 2022-02-13 ENCOUNTER — Inpatient Hospital Stay
Admission: RE | Admit: 2022-02-13 | Discharge: 2022-02-13 | Disposition: A | Discharge status: Home / Self Care | Payer: Medicare Other | Source: Ambulatory Visit | Attending: Family Medicine | Admitting: Family Medicine

## 2022-02-13 DIAGNOSIS — E039 Hypothyroidism, unspecified: Secondary | ICD-10-CM | POA: Insufficient documentation

## 2022-02-19 ENCOUNTER — Other Ambulatory Visit: Payer: Self-pay

## 2022-02-19 ENCOUNTER — Emergency Department (HOSPITAL_COMMUNITY): Payer: Medicare Other

## 2022-02-19 ENCOUNTER — Emergency Department
Admission: EM | Admit: 2022-02-19 | Discharge: 2022-02-20 | Disposition: A | Discharge status: Home / Self Care | Payer: Medicare Other | Attending: Emergency Medicine | Admitting: Emergency Medicine

## 2022-02-19 ENCOUNTER — Encounter (HOSPITAL_COMMUNITY): Payer: Self-pay

## 2022-02-19 DIAGNOSIS — W010XXA Fall on same level from slipping, tripping and stumbling without subsequent striking against object, initial encounter: Secondary | ICD-10-CM

## 2022-02-19 DIAGNOSIS — M5430 Sciatica, unspecified side: Secondary | ICD-10-CM

## 2022-02-19 DIAGNOSIS — S80219A Abrasion, unspecified knee, initial encounter: Secondary | ICD-10-CM

## 2022-02-19 DIAGNOSIS — S01511A Laceration without foreign body of lip, initial encounter: Secondary | ICD-10-CM

## 2022-02-19 DIAGNOSIS — F32A Depression, unspecified: Secondary | ICD-10-CM

## 2022-02-19 DIAGNOSIS — S80212A Abrasion, left knee, initial encounter: Secondary | ICD-10-CM

## 2022-02-19 DIAGNOSIS — R4589 Other symptoms and signs involving emotional state: Secondary | ICD-10-CM

## 2022-02-19 DIAGNOSIS — X838XXA Intentional self-harm by other specified means, initial encounter: Secondary | ICD-10-CM

## 2022-02-19 HISTORY — DX: Lesion of sciatic nerve, unspecified lower limb: G57.00

## 2022-02-19 HISTORY — DX: Sacroiliitis, not elsewhere classified: M46.1

## 2022-02-19 LAB — URINALYSIS, MICROSCOPIC
HYALINE CASTS: 19 /lpf — ABNORMAL HIGH (ref <0)
NON-SQUAMOUS EPITHELIAL CELLS URINE: <1 /hpf (ref <=1)
RBCS: <1 /hpf (ref <4)
SQUAMOUS EPITHELIAL: 3 /hpf (ref <28)
WBCS: 5 /hpf (ref <6)

## 2022-02-19 LAB — CBC WITH DIFF
BASOPHIL #: 0.1 10*3/uL (ref 0.00–0.30)
BASOPHIL %: 1 % (ref 0–3)
EOSINOPHIL #: 0.2 10*3/uL (ref 0.00–0.80)
EOSINOPHIL %: 2 % (ref 0–7)
HCT: 40.7 % (ref 37.0–47.0)
HGB: 13.6 g/dL (ref 12.5–16.0)
LYMPHOCYTE #: 3.2 10*3/uL (ref 1.10–5.00)
LYMPHOCYTE %: 34 % (ref 25–45)
MCH: 28.5 pg (ref 27.0–32.0)
MCHC: 33.3 g/dL (ref 32.0–36.0)
MCV: 85.6 fL (ref 78.0–99.0)
MONOCYTE #: 0.6 10*3/uL (ref 0.00–1.30)
MONOCYTE %: 7 % (ref 0–12)
MPV: 9.4 fL (ref 7.4–10.4)
NEUTROPHIL #: 5.3 10*3/uL (ref 1.80–8.40)
NEUTROPHIL %: 56 % (ref 40–76)
PLATELETS: 218 10*3/uL (ref 140–440)
RBC: 4.76 10*6/uL (ref 4.20–5.40)
RDW: 14.9 % — ABNORMAL HIGH (ref 11.6–14.8)
WBC: 9.4 10*3/uL (ref 4.0–10.5)
WBCS UNCORRECTED: 9.4 10*3/uL

## 2022-02-19 LAB — COMPREHENSIVE METABOLIC PANEL, NON-FASTING
ALBUMIN/GLOBULIN RATIO: 1.1 (ref 0.8–1.4)
ALBUMIN: 4 g/dL (ref 3.5–5.7)
ALKALINE PHOSPHATASE: 77 U/L (ref 34–104)
ALT (SGPT): 20 U/L (ref 7–52)
ANION GAP: 10 mmol/L (ref 10–20)
AST (SGOT): 15 U/L (ref 13–39)
BILIRUBIN TOTAL: 0.4 mg/dL (ref 0.3–1.2)
BUN/CREA RATIO: 34 — ABNORMAL HIGH (ref 6–22)
BUN: 30 mg/dL — ABNORMAL HIGH (ref 7–25)
CALCIUM, CORRECTED: 9.3 mg/dL (ref 8.9–10.8)
CALCIUM: 9.3 mg/dL (ref 8.6–10.3)
CHLORIDE: 105 mmol/L (ref 98–107)
CO2 TOTAL: 20 mmol/L — ABNORMAL LOW (ref 21–31)
CREATININE: 0.89 mg/dL (ref 0.60–1.30)
ESTIMATED GFR: 73 mL/min/{1.73_m2} (ref >59)
GLOBULIN: 3.6 (ref 2.9–5.4)
GLUCOSE: 214 mg/dL — ABNORMAL HIGH (ref 74–109)
OSMOLALITY, CALCULATED: 283 mOsm/kg (ref 270–290)
POTASSIUM: 4.7 mmol/L (ref 3.5–5.1)
PROTEIN TOTAL: 7.6 g/dL (ref 6.4–8.9)
SODIUM: 135 mmol/L — ABNORMAL LOW (ref 136–145)

## 2022-02-19 LAB — URINE DRUG SCREEN
AMPHET QL: NEGATIVE
BARB QL: NEGATIVE
BENZO QL: NEGATIVE
BUP QL: NEGATIVE
CANNAQL: NEGATIVE
COCQL: NEGATIVE
METHQL: NEGATIVE
OPIATE: NEGATIVE
OXYCODONE URINE: NEGATIVE
PCP QL: NEGATIVE
TRICYCLIC ANTIDEPRESSANTS URINE: POSITIVE — AB

## 2022-02-19 LAB — URINALYSIS, MACROSCOPIC
BILIRUBIN: NEGATIVE mg/dL
BLOOD: NEGATIVE mg/dL
GLUCOSE: NEGATIVE mg/dL
KETONES: NEGATIVE mg/dL
LEUKOCYTES: 75 WBCs/uL — AB
NITRITE: NEGATIVE
PH: 5 (ref 5.0–9.0)
PROTEIN: NEGATIVE mg/dL
SPECIFIC GRAVITY: 1.022 (ref 1.002–1.030)
UROBILINOGEN: NORMAL mg/dL

## 2022-02-19 LAB — ETHANOL, SERUM: ETHANOL: <10 mg/dL — ABNORMAL HIGH

## 2022-02-19 LAB — THYROID STIMULATING HORMONE WITH FREE T4 REFLEX: TSH: 3.224 u[IU]/mL (ref 0.450–5.330)

## 2022-02-19 MED ORDER — DIPHTH,PERTUSSIS(ACEL),TETANUS 2.5 LF UNIT-8 MCG-5 LF/0.5ML IM SYRINGE
0.5000 mL | INJECTION | Freq: Once | INTRAMUSCULAR | Status: AC
Start: 2022-02-19 — End: 2022-02-19
  Administered 2022-02-19: 0.5 mL via INTRAMUSCULAR
  Filled 2022-02-19: qty 0.5

## 2022-02-19 MED ORDER — HYDROCODONE 5 MG-ACETAMINOPHEN 325 MG TABLET
ORAL_TABLET | ORAL | Status: AC
Start: 2022-02-19 — End: 2022-02-19
  Filled 2022-02-19: qty 2

## 2022-02-19 MED ORDER — LIDOCAINE 1 %-EPINEPHRINE 1:100,000 INJECTION SOLUTION
15.0000 mL | INTRAMUSCULAR | Status: AC
Start: 2022-02-19 — End: 2022-02-19
  Administered 2022-02-19: 150 mg via INTRADERMAL

## 2022-02-19 MED ORDER — HYDROCODONE 5 MG-ACETAMINOPHEN 325 MG TABLET
2.0000 | ORAL_TABLET | ORAL | Status: AC
Start: 2022-02-19 — End: 2022-02-19
  Administered 2022-02-19: 2 via ORAL

## 2022-02-19 MED ORDER — LIDOCAINE 1 %-EPINEPHRINE 1:100,000 INJECTION SOLUTION
INTRAMUSCULAR | Status: AC
Start: 2022-02-19 — End: 2022-02-19
  Filled 2022-02-19: qty 50

## 2022-02-19 NOTE — ED Nurses Note (Signed)
Pt into Ed after a fall where she obtained a laceration to inner, lower lip. Upon arrival in ED pt is tearful, denies s/i at this time, but has had suicidal thoughts this week w/ no plans. Provider in room and has educated pt. On process of what happens after stating s/i. Husband in room who is encouraging pt to stay in hospital. AAOx3. PERRL. Respiration even, non-labored on RA.

## 2022-02-19 NOTE — Discharge Instructions (Addendum)
Suture check in 2 days removal in 7 days.  Sutures inside the mouth are absorbable and will dissolve on their own.  Follow-up with your family doctor; be sure to discuss MRI/physical therapy for your sciatica  Return to emergency room for any nausea, vomiting, blurred vision, mental status change, or any concerns  Take all regular medications as prescribed

## 2022-02-19 NOTE — ED Provider Notes (Signed)
CHIEF COMPLAINT  Chief Complaint   Patient presents with   . Fall     HISTORY OF PRESENT ILLNESS  Jasmine Bates, date of birth [birth year], is a 66 y.o. female who presented to the Emergency Department      66 year old female with history of sciatica.  For many months she is had a problem with her knees giving out and falling.  She felt today striking her chin on the ground suffering a laceration to her lip.  She denies any worsening of her back pain no saddle anesthesia no urinary retention no fecal incontinence.  No worsening weakness over lower extremities.  She is worked with the primary care doctor to get an MRI of her lumbar spine.  Unfortunately it seems to be taking quite some time she is very despondent in fact she stated in triage that she was suicidal over this process.  She does not have a specific plan but she had told her husband this in the past and she repeats here in the emergency department.    PAST MEDICAL/SURGICAL/FAMILY/SOCIAL HISTORY  Past Medical History:   Diagnosis Date   . BRCA1 gene mutation negative    . BRCA2 gene mutation negative    . COPD (chronic obstructive pulmonary disease) (CMS HCC)    . Diabetes mellitus, type 2 (CMS HCC)    . Essential hypertension    . Fatty liver    . Hyperlipidemia    . Hypothyroidism    . Osteoarthritis    . Piriformis syndrome    . Sacroiliitis (CMS HCC)        Past Surgical History:   Procedure Laterality Date   . CESAREAN SECTION     . HX BREAST LUMPECTOMY Right     10/2018 positive  pt had radiation   . HX GASTRIC SLEEVE         Family Medical History:     Problem Relation (Age of Onset)    Hypertension (High Blood Pressure) Father    Lung Cancer Mother    Melanoma Mother    No Known Problems Sister, Brother, Maternal Grandmother, Maternal Grandfather, Paternal Grandmother, Paternal Grandfather, Daughter, Son, Maternal Aunt, Maternal Uncle, Paternal 28, Paternal Uncle, Other        Social History     Socioeconomic History   . Marital status: Married    Tobacco Use   . Smoking status: Never   . Smokeless tobacco: Never   Vaping Use   . Vaping Use: Never used   Substance and Sexual Activity   . Alcohol use: Never   . Drug use: Never      ALLERGIES  Allergies   Allergen Reactions   . Nsaids (Non-Steroidal Anti-Inflammatory Drug) Anaphylaxis   . Sulfamethoxazole-Trimethoprim Angioedema and Rash   . Aspirin    . Sulfa (Sulfonamides)        PHYSICAL EXAM  VITAL SIGNS:  Filed Vitals:    02/19/22 2121 02/19/22 2142 02/19/22 2145   BP: (!) 127/109  (!) 170/93   Pulse: 89     Resp: 18     Temp: 36.4 C (97.6 F) 36.8 C (98.2 F)    SpO2: 98%  100%     GENERAL: PATIENT IS ALERT AND ORIENTED TO PERSON, PLACE, AND TIME.  Tear  HEAD: NORMOCEPHALIC there laceration to the lower lip small laceration on the outside alert laceration inside does not cross the vermilion border  EYES: PUPILS EQUALLY ROUND AND REACT TO LIGHT. EXTRAOCULAR MOVEMENTS INTACT.  EARS:  GROSS HEARING INTACT. EXTERNAL EARS WITHIN NORMAL LIMITS.  THROAT: MOIST ORAL MUCOSA. NO ERYTHEMA OR EXUDATE OF THE PHARYNX.  NECK: SUPPLE. TRACHEA MIDLINE.  NO LYMPHADENOPATHY  CARDIOVASCULAR: REGULAR, RATE, AND RHYTHM. NO MURMUR.  LUNGS: CLEAR TO AUSCULTATION BILATERAL.  ABDOMEN: SOFT, NON-TENDER, NON-DISTENDED, AND BOWEL SOUNDS ARE PRESENT.  GENITOURINARY: DEFERRED.  RECTAL: DEFERRED.  EXTREMITIES: NO CYANOSIS, CLUBBING, OR EDEMA.  NO GROSS DEFORMITIES, MOVES ALL 4 EXTREMITIES she has abrasions to both knees with mild swelling bilaterally  SKIN: WARM AND DRY.  NEUROLOGIC: CRANIAL NERVES II THROUGH XII ARE GROSSLY INTACT ALTHOUGH NOT INDIVIDUALLY TESTED.  No gross motor deficits  PSYCHIATRIC:  She is depressed and suicidal with no specific plan.    PROCEDURES    Suture repair: Lower lip  There are 2 small lacerations approximately 3 mm on the outer lip and 1 cm on the inner lip.  Lidocaine 1% with epi was injected for a total of 2 mL.  The outer lip was repaired with 4-0 nylon simple interrupted 1 stitch the inner lip was  repaired with 4-0 absorbable simple interrupted 3 stitches patient tolerated well    DIAGNOSTICS  Labs:  Labs listed below were reviewed and interpreted by me.  Results for orders placed or performed during the hospital encounter of 02/19/22   COMPREHENSIVE METABOLIC PANEL, NON-FASTING   Result Value Ref Range    SODIUM 135 (L) 136 - 145 mmol/L    POTASSIUM 4.7 3.5 - 5.1 mmol/L    CHLORIDE 105 98 - 107 mmol/L    CO2 TOTAL 20 (L) 21 - 31 mmol/L    ANION GAP 10 10 - 20 mmol/L    BUN 30 (H) 7 - 25 mg/dL    CREATININE 0.89 0.60 - 1.30 mg/dL    BUN/CREA RATIO 34 (H) 6 - 22    ESTIMATED GFR 73 >59 mL/min/1.27m2    ALBUMIN 4.0 3.5 - 5.7 g/dL    CALCIUM 9.3 8.6 - 10.3 mg/dL    GLUCOSE 214 (H) 74 - 109 mg/dL    ALKALINE PHOSPHATASE 77 34 - 104 U/L    ALT (SGPT) 20 7 - 52 U/L    AST (SGOT) 15 13 - 39 U/L    BILIRUBIN TOTAL 0.4 0.3 - 1.2 mg/dL    PROTEIN TOTAL 7.6 6.4 - 8.9 g/dL    ALBUMIN/GLOBULIN RATIO 1.1 0.8 - 1.4    OSMOLALITY, CALCULATED 283 270 - 290 mOsm/kg    CALCIUM, CORRECTED 9.3 8.9 - 10.8 mg/dL    GLOBULIN 3.6 2.9 - 5.4   ETHANOL, SERUM   Result Value Ref Range    ETHANOL <10 (H) 0 mg/dL   THYROID STIMULATING HORMONE WITH FREE T4 REFLEX   Result Value Ref Range    TSH 3.224 0.450 - 5.330 uIU/mL   CBC WITH DIFF   Result Value Ref Range    WBCS UNCORRECTED 9.4 x10^3/uL    WBC 9.4 4.0 - 10.5 x10^3/uL    RBC 4.76 4.20 - 5.40 x10^6/uL    HGB 13.6 12.5 - 16.0 g/dL    HCT 40.7 37.0 - 47.0 %    MCV 85.6 78.0 - 99.0 fL    MCH 28.5 27.0 - 32.0 pg    MCHC 33.3 32.0 - 36.0 g/dL    RDW 14.9 (H) 11.6 - 14.8 %    PLATELETS 218 140 - 440 x10^3/uL    MPV 9.4 7.4 - 10.4 fL    NEUTROPHIL % 56 40 - 76 %    LYMPHOCYTE %  34 25 - 45 %    MONOCYTE % 7 0 - 12 %    EOSINOPHIL % 2 0 - 7 %    BASOPHIL % 1 0 - 3 %    NEUTROPHIL # 5.30 1.80 - 8.40 x10^3/uL    LYMPHOCYTE # 3.20 1.10 - 5.00 x10^3/uL    MONOCYTE # 0.60 0.00 - 1.30 x10^3/uL    EOSINOPHIL # 0.20 0.00 - 0.80 x10^3/uL    BASOPHIL # 0.10 0.00 - 0.30 x10^3/uL     Radiology:       ED  COURSE/MEDICAL DECISION MAKING  Medications Administered in the ED   HYDROcodone-acetaminophen (NORCO) 5-325 mg per tablet (2 Tablets Oral Given 02/19/22 2222)   diphtheria, pertussis-acell, tetanus (BOOSTRIX) IM injection (0.5 mL IntraMUSCULAR Given 02/19/22 2223)   lidocaine 1%-EPINEPHrine 1:100,000 injection (150 mg Intradermal Given(Other Clinician) 02/19/22 2300)          Medical Decision Making  Patient's lip laceration has been repaired.  She is medically clear for psychiatric admission and evaluation      CRITICAL CARE    CLINICAL IMPRESSION  Clinical Impression   Lip laceration (Primary)   Knee abrasion   Symptoms of depression   Suicide (CMS HCC)   Sciatica, unspecified laterality     DISPOSITION  Transfered to [facility]  Current Discharge Medication List          //Doug Glorianne Manchester M.D.   02/19/2022, 23:16   [facility]  Department of Emergency Medicine  [facility]    This note was partially generated using MModal Fluency Direct system, and there may be some incorrect words, spellings, and punctuation that were not noted in checking the note before saving.    -----

## 2022-02-19 NOTE — ED Triage Notes (Addendum)
Pt arrives to ED following fall at home after tripping on her family members porch. Pt reports she fell from standing face first onto the concrete. Denies any LOC. Reports pain to her head and bilateral knees. States pain is currently 2/10, no OTC pain medication use. Pt also reports some pain to bottom lip. Small laceration from upper teeth noted. Bleeding controlled in triage. No anticoagulant use. PERRL. While in triage, patient reports she has "sciatica" pain at baseline and cannot get anyone to schedule her MRI. Reports active SI thoughts after past several weeks due to the pain and inability to function at her previous capacity. Reports frequent falls from "legs just give out." Pt tearful in triage. Family member at bedside. Charge called and informed.

## 2022-02-20 NOTE — ED Nurses Note (Signed)
Pt denies s/i. Has been d/c to home. Education given. Aaox3. No c/o voiced at this time. Ambulatory, gait steady. Husband at side.

## 2022-02-20 NOTE — ED Nurses Note (Signed)
Crisis center has called and told this nurse that pt has denied any s/i and states she would never hurt herself.

## 2022-02-20 NOTE — ED Nurses Note (Signed)
Patient referred to the [facility] at this time.

## 2022-02-20 NOTE — ED Attending Handoff Note (Signed)
[facility]  Emergency Department  Course Note    Care/report received from [place] midnight  Per report:  Jasmine Bates is a 66 y.o. female who had concerns including Fall.  Lip laceration  Pending labs/imaging/consults:  Patient was discharged to the [facility]; however after the [facility] speaking with patient they have declined to take patient.  She is denying any SI.  Laceration has been closed.  Patient states that she has seen Dr. Ruthell Rummage office who are working on getting her scheduled for an MRI.  Plan:  Patient be discharged home.  See discharge instructions for detailed.     Following the history, physical exam, and ED workup, the patient was deemed stable and suitable for discharge. The patient/caregiver was advised to return to the ED for any new or worsening symptoms. Discharge medications, and follow-up instructions were discussed with the patient/caregiver in detail, who verbalizes understanding. The patient/caregiver is in agreement and is comfortable with the plan of care.    Disposition: Discharged         Current Discharge Medication List      CONTINUE these medications - NO CHANGES were made during your visit.      Details   acarbose 25 mg Tablet  Commonly known as: PRECOSE   No dose, route, or frequency recorded.  Refills: 0     albuterol sulfate 90 mcg/actuation oral inhaler  Commonly known as: PROVENTIL or VENTOLIN or PROAIR   1-2 Puffs, Inhalation  Refills: 0     anastrozole 1 mg Tablet  Commonly known as: ARIMIDEX   1 mg, Oral, DAILY  Qty: 30 Tablet  Refills: 2     atorvastatin 10 mg Tablet  Commonly known as: LIPITOR   No dose, route, or frequency recorded.  Refills: 0     BD UF Nano Pen Needle 32 gauge x 5/32" Needle  Generic drug: Pen Needle (Disposable)   USE AS DIRECTED WITH INSULIN PEN  Refills: 0     biotin 1 mg Capsule   Oral  Refills: 0     bisacodyL 5 mg Tablet, Delayed Release (E.C.)  Commonly known as: DULCOLAX   5 mg, Oral  Refills: 0      budesonide-formoteroL 160-4.5 mcg/actuation oral inhaler  Commonly known as: SYMBICORT   No dose, route, or frequency recorded.  Refills: 0     cetirizine 10 mg Tablet  Commonly known as: ZYRTEC   10 mg, Oral, DAILY  Refills: 0     Cholecalciferol (Vitamin D3) 50 mcg (2,000 unit) Capsule   1 Tablet, Oral  Refills: 0     CRANBERRY POWDER ORAL   1 Tablet, Oral  Refills: 0     ergocalciferol (vitamin D2) 1,250 mcg (50,000 unit) Capsule  Commonly known as: DRISDOL   No dose, route, or frequency recorded.  Refills: 0     Fesoterodine 4 mg Tablet Sustained Release 24 hr   1 Tablet, Oral, DAILY  Refills: 0     FreeStyle Libre 14 Day Sensor Kit  Generic drug: flash glucose sensor   USE TO CHECK BLOOD SUGAR. CHANGE SENSOR EVERY 14 DAYS.  Refills: 0     FreeStyle Libre 3 Sensor Device  Generic drug: Blood-Glucose Sensor   APPLY EVERY 14 DAYS  Refills: 0     glimepiride 4 mg Tablet  Commonly known as: AMARYL   No dose, route, or frequency recorded.  Refills: 0     Lantus Solostar U-100 Insulin 100 unit/mL Insulin Pen  Generic drug: insulin glargine   INJECT 30 UNITS INTO THE SKIN NIGHTLY  Refills: 0     levothyroxine 100 mcg Tablet  Commonly known as: SYNTHROID   No dose, route, or frequency recorded.  Refills: 0     lisinopriL 20 mg Tablet  Commonly known as: PRINIVIL   No dose, route, or frequency recorded.  Refills: 0     metFORMIN 500 mg Tablet Sustained Release 24 hr  Commonly known as: GLUCOPHAGE XR   TAKE 2 TABLETS BY MOUTH 2 TIMES DAILY (WITH MEALS)  Refills: 0     montelukast 10 mg Tablet  Commonly known as: SINGULAIR   No dose, route, or frequency recorded.  Refills: 0     Myrbetriq 25 mg Tablet Sustained Release 24 hr  Generic drug: mirabegron   No dose, route, or frequency recorded.  Refills: 0     ondansetron 4 mg Tablet, Rapid Dissolve  Commonly known as: ZOFRAN ODT   No dose, route, or frequency recorded.  Refills: 0     Osphena 60 mg Tablet  Generic drug: ospemifene   1 Tablet, Oral, DAILY  Refills: 0      pantoprazole 40 mg Tablet, Delayed Release (E.C.)  Commonly known as: PROTONIX   No dose, route, or frequency recorded.  Refills: 0     pioglitazone 30 mg Tablet  Commonly known as: ACTOS   No dose, route, or frequency recorded.  Refills: 0     * Rybelsus 7 mg Tablet  Generic drug: semaglutide   No dose, route, or frequency recorded.  Refills: 0     * Ozempic 0.25 mg or 0.5 mg(2 mg/1.5 mL) Pen Injector  Generic drug: semaglutide   0.5 mg  Refills: 0     * sertraline 50 mg Tablet  Commonly known as: ZOLOFT   No dose, route, or frequency recorded.  Refills: 0     * sertraline 100 mg Tablet  Commonly known as: ZOLOFT   100 mg, Oral, DAILY  Refills: 0     SUMAtriptan 50 mg Tablet  Commonly known as: IMITREX   No dose, route, or frequency recorded.  Refills: 0     turmeric-ginger-black pepper 125 mg-6 mg- 50 mcg Tablet, Chewable   1 Tablet, Oral  Refills: 0     verapamiL 300 mg Capsule, 24hr ER Pellet CT  Commonly known as: VERELAN PM   No dose, route, or frequency recorded.  Refills: 0         * This list has 4 medication(s) that are the same as other medications prescribed for you. Read the directions carefully, and ask your doctor or other care provider to review them with you.            ASK your doctor about these medications.      Details   predniSONE 20 mg Tablet  Commonly known as: DELTASONE  Ask about: Should I take this medication?   20 mg, Oral, 3 TIMES DAILY  Qty: 15 Tablet  Refills: 0          Follow up:   No follow-up provider specified.    Clinical Impression   Lip laceration (Primary)   Knee abrasion   Symptoms of depression   Sciatica, unspecified laterality         Regina Eck, DO  02/20/2022, 02:22

## 2022-02-21 LAB — URINE CULTURE,ROUTINE: URINE CULTURE: >100000 — AB

## 2022-03-10 IMAGING — MR MRI HIP LT W/O CONTRAST
5 of 7 series · 25 of 40 positions shown · non-contrast
Comparison: None available.

﻿EXAM:  20257   MRI HIP LT W/O CONTRAST
INDICATION: Left hip and leg pain.
TECHNIQUE: Noncontrast multiplanar, multisequence MRI was performed.

[Series 7: T2 fat-sat · axial · left · 4.5mm · 0.94mm/px · z∈[-103,+42]mm · 6 of 30 slices shown (1 of 3)]
[im 1/30]
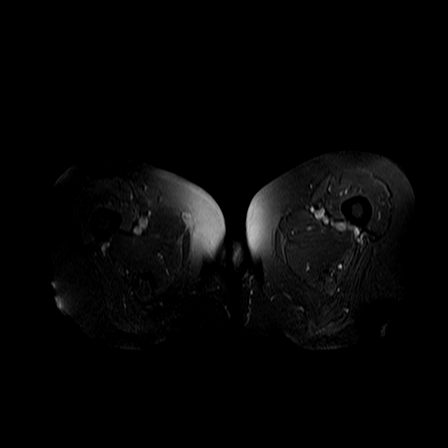
[im 6/30]
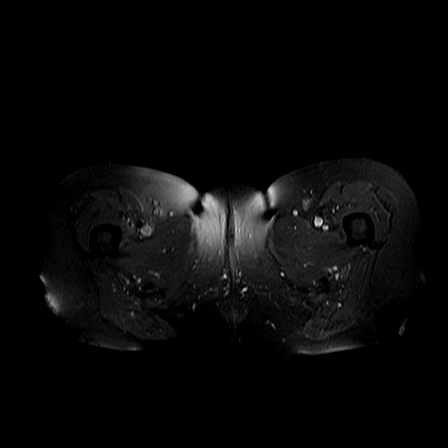
[im 12/30]
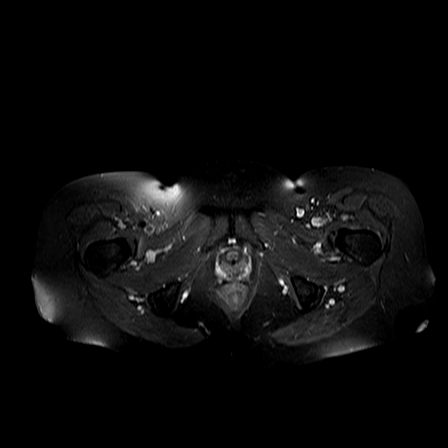
[im 18/30]
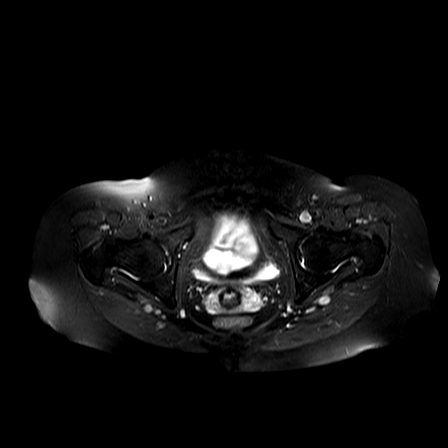
[im 24/30]
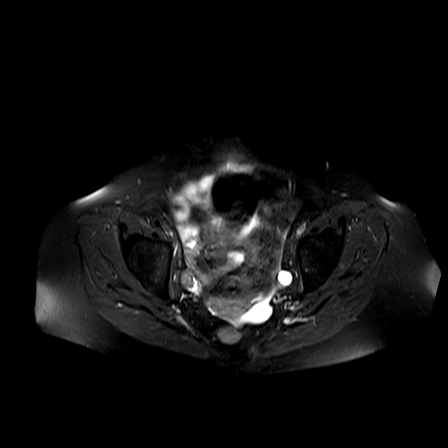
[im 30/30]
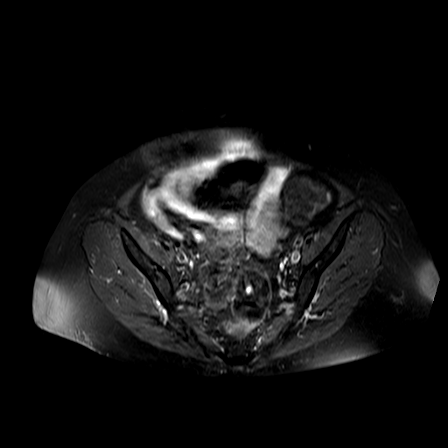

[Series 8: T1 · axial · left · 4.5mm · 0.82mm/px · z∈[-103,+42]mm · 7 of 30 slices shown (1 of 2)]
[im 1/30]
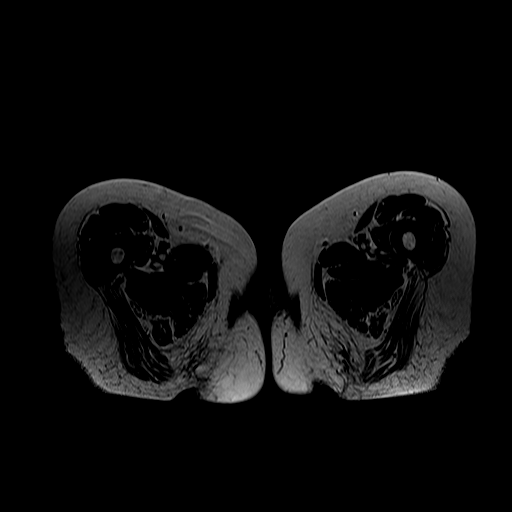
[im 5/30]
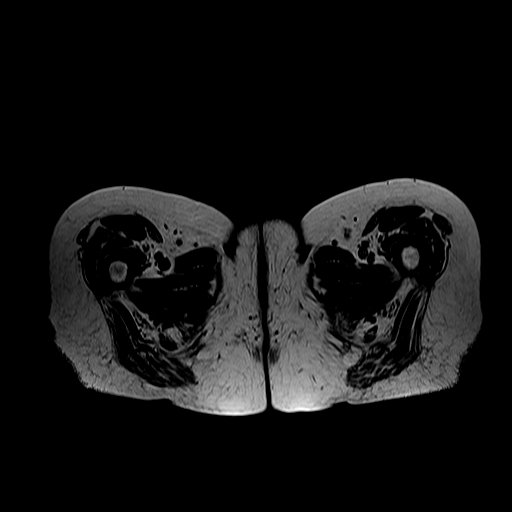
[im 10/30]
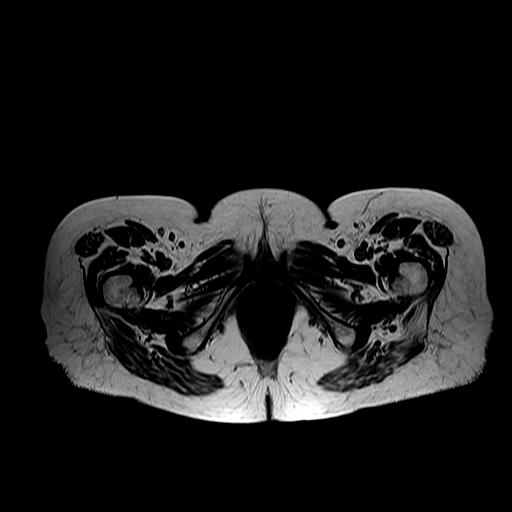
[im 15/30]
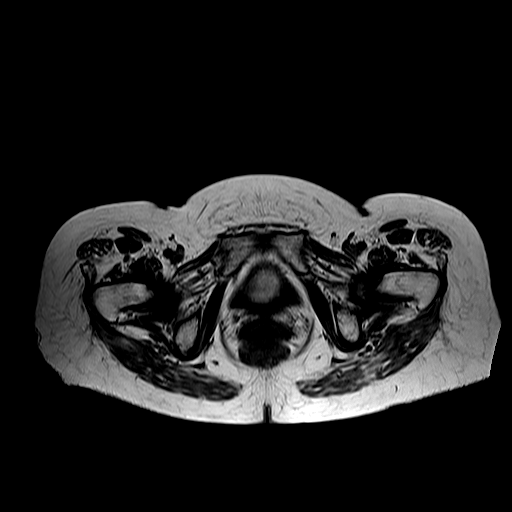
[im 20/30]
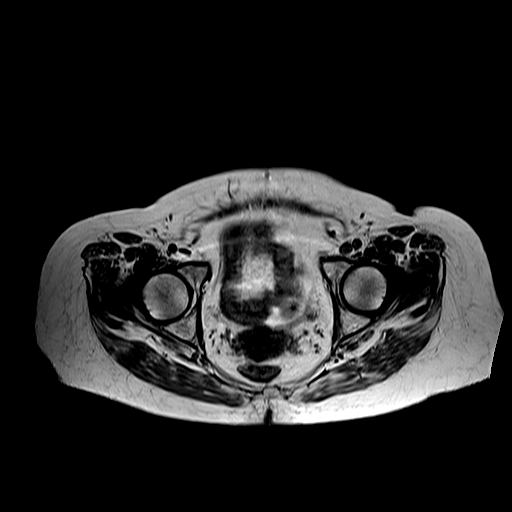
[im 25/30]
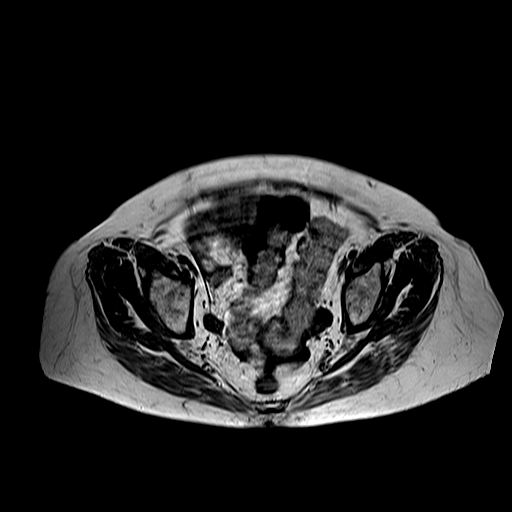
[im 30/30]
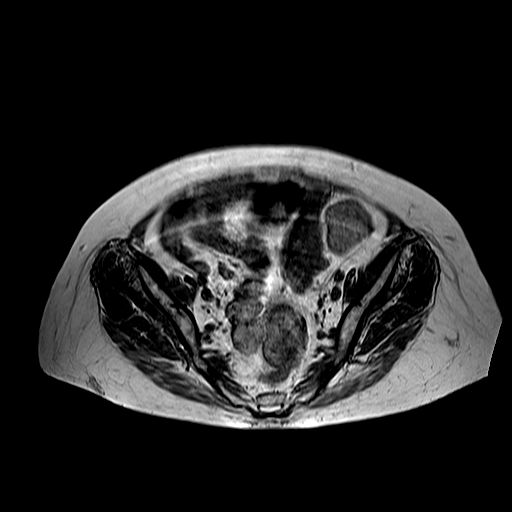

[Series 10: T1 · coronal · left · 4.0mm · 0.82mm/px · 2 of 20 slices shown (2 of 2)]
[im 1/20]
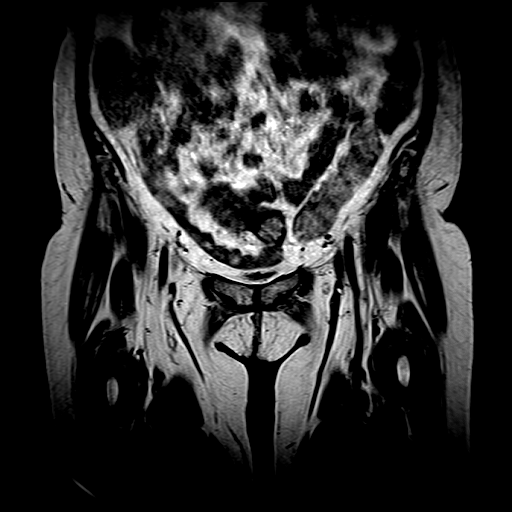
[im 5/20]
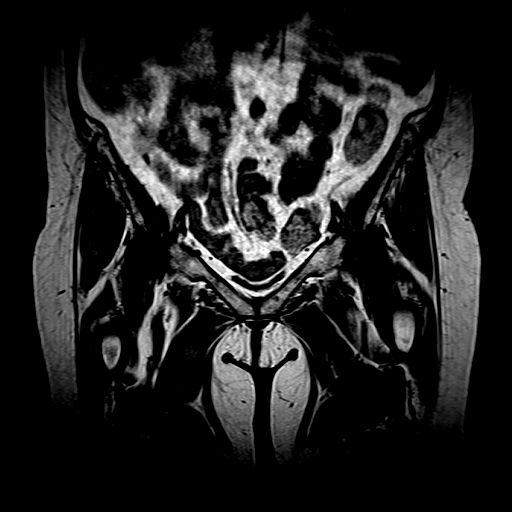

[Series 11: T2 fat-sat · coronal · left · 4.0mm · 0.82mm/px · 5 of 20 slices shown (2 of 3)]
[im 1/20]
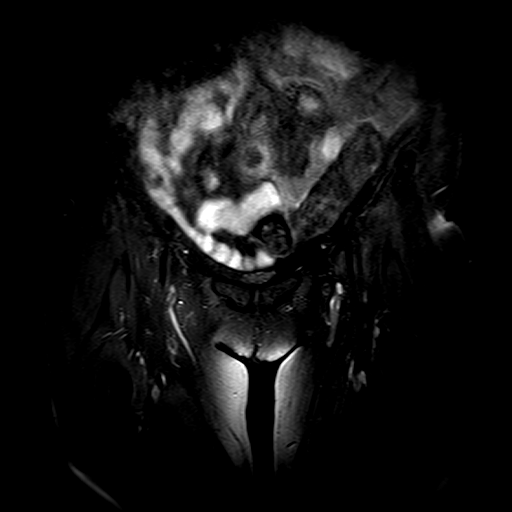
[im 5/20]
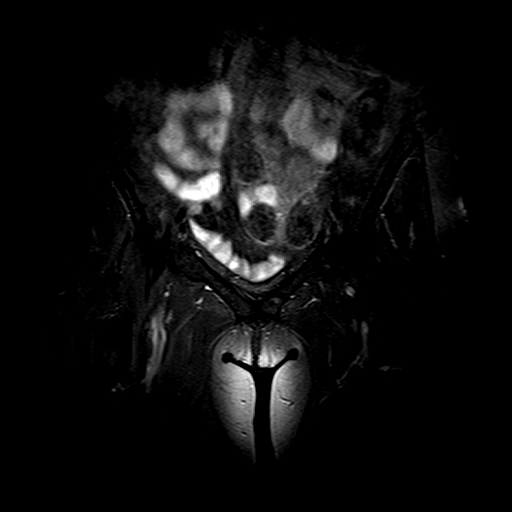
[im 10/20]
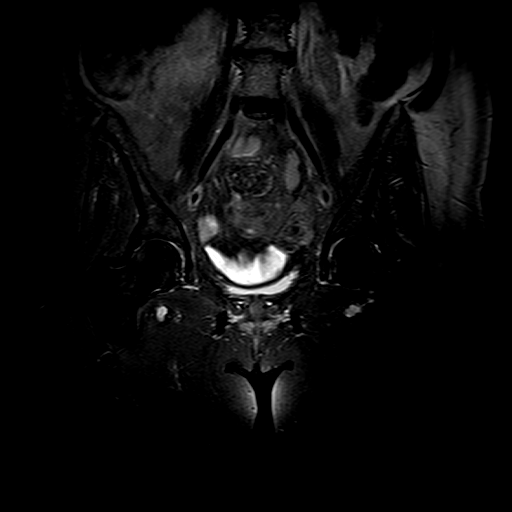
[im 15/20]
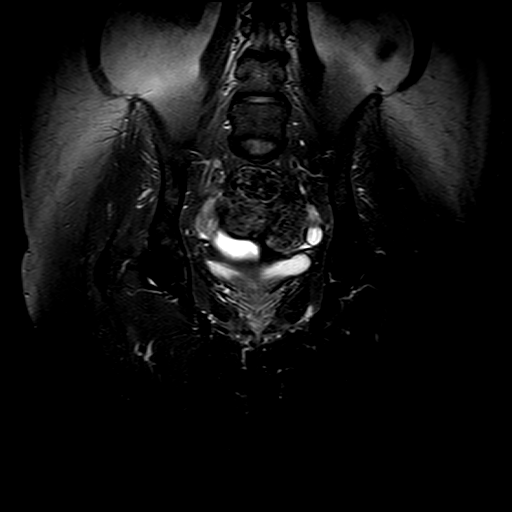
[im 20/20]
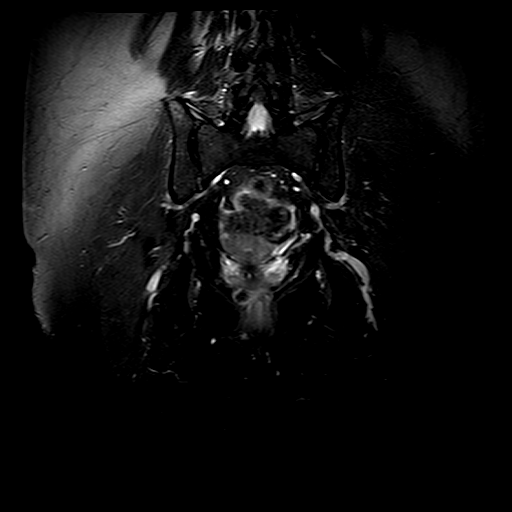

[Series 13: T2 fat-sat · sagittal · left · 4.5mm · 0.78mm/px · 5 of 20 slices shown (3 of 3)]
[im 1/20]
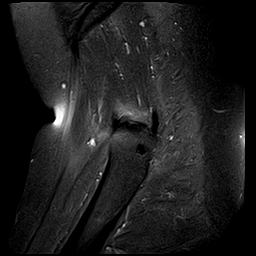
[im 5/20]
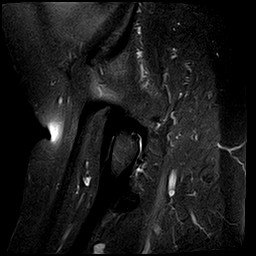
[im 10/20]
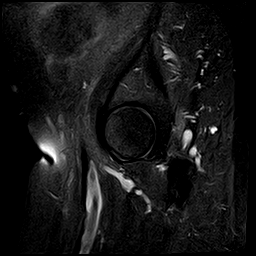
[im 15/20]
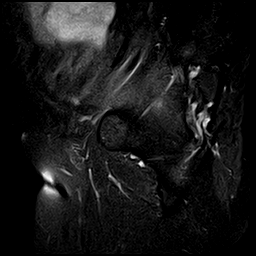
[im 20/20]
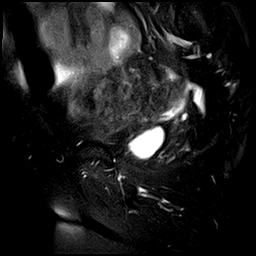

[25 of 40 positions shown; findings below may reference images not displayed]

FINDINGS: Both hips demonstrate minimal degenerative change.  

There is no fracture, dislocation, or evidence of avascular necrosis.  There is no joint effusion, evidence of bursitis, or significant abnormal fluid collection.  There is no evidence of bone contusion or significant marrow signal alteration.
IMPRESSION: 1. Minimal bilateral hip osteoarthritis.  

2. Otherwise unremarkable examination.

## 2022-04-10 IMAGING — MR MRI LUMBAR SPINE WITHOUT CONTRAST
5 of 6 series · 34 of 48 positions shown · non-contrast
Comparison: None available.

﻿EXAM:  01353   MRI LUMBAR SPINE WITHOUT CONTRAST
INDICATION: Low back, left leg and hip pain.
TECHNIQUE: Noncontrast multiplanar, multisequence MRI was performed.

[Series 8: T2 · sagittal · 4.0mm · 0.94mm/px · 6 of 15 slices shown (1 of 3)]
[im 1/15]
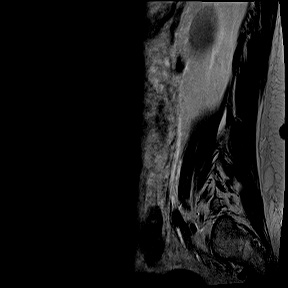
[im 3/15]
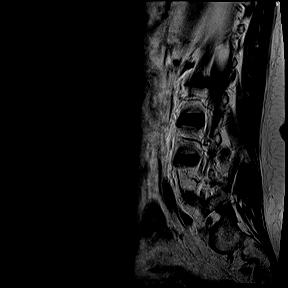
[im 6/15]
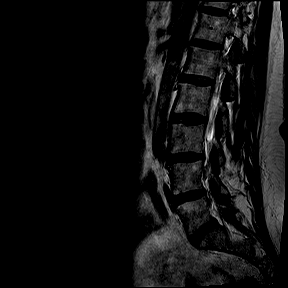
[im 9/15]
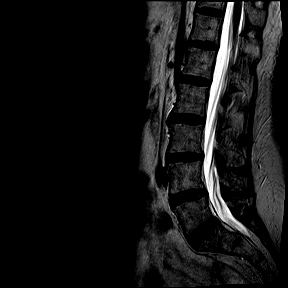
[im 12/15]
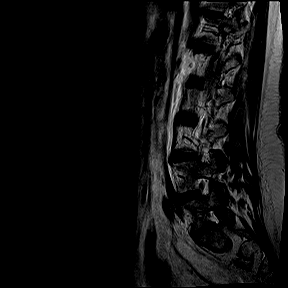
[im 15/15]
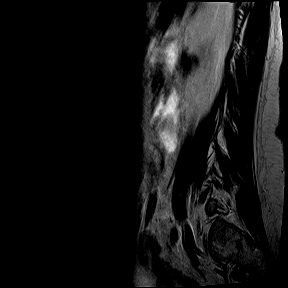

[Series 9: T1 · sagittal · 4.0mm · 0.94mm/px · 7 of 15 slices shown (1 of 2)]
[im 1/15]
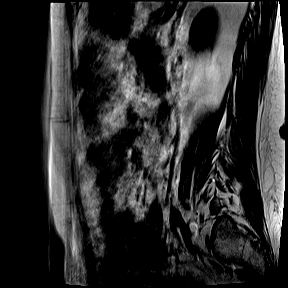
[im 3/15]
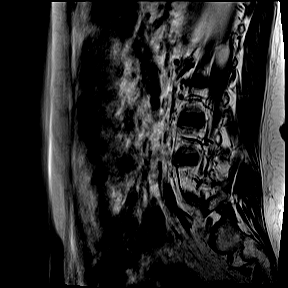
[im 5/15]
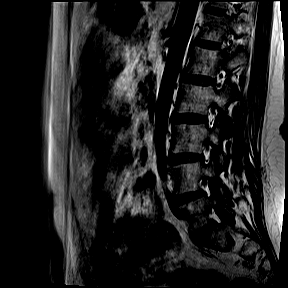
[im 8/15]
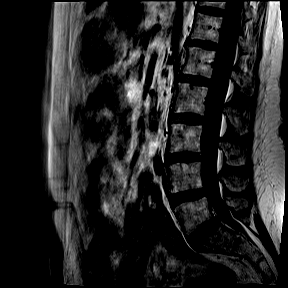
[im 10/15]
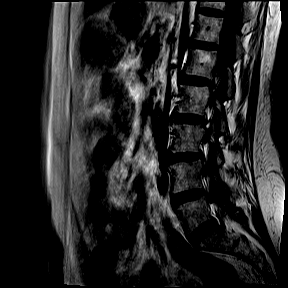
[im 12/15]
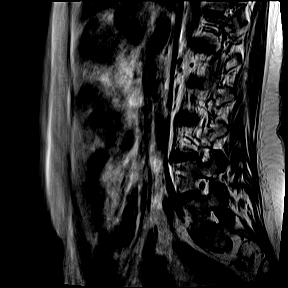
[im 15/15]
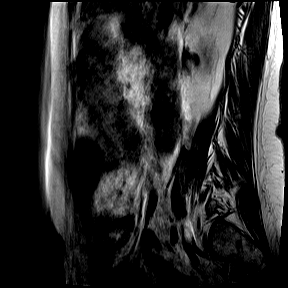

[Series 11: T2 · axial · 4.0mm · 0.52mm/px · z∈[-153,+63]mm · 8 of 23 slices shown (2 of 3)]
[im 1/23]
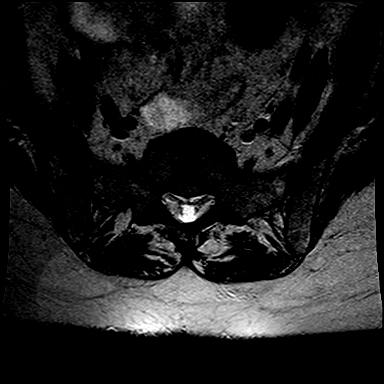
[im 3/23]
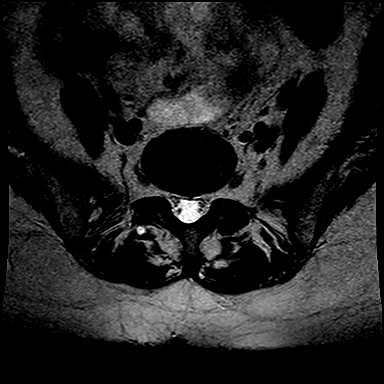
[im 8/23]
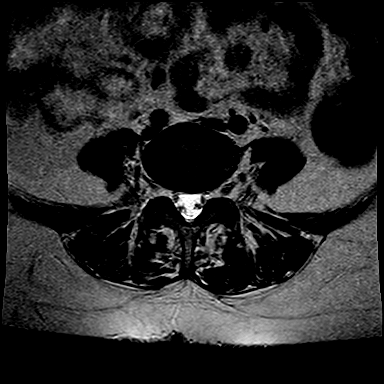
[im 10/23]
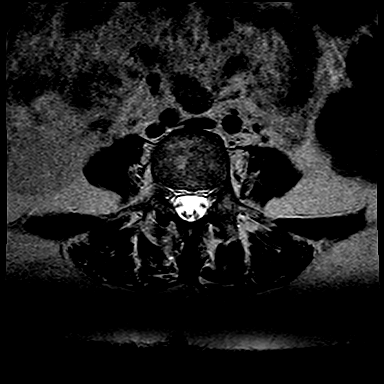
[im 13/23]
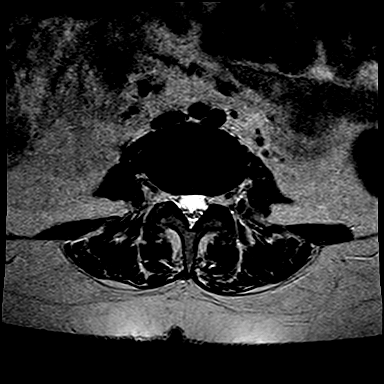
[im 15/23]
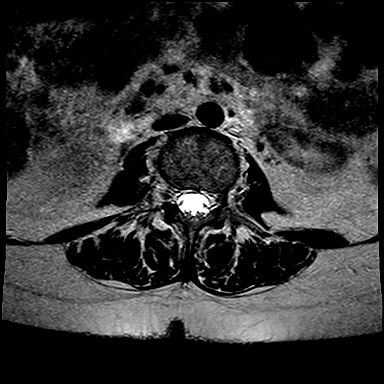
[im 20/23]
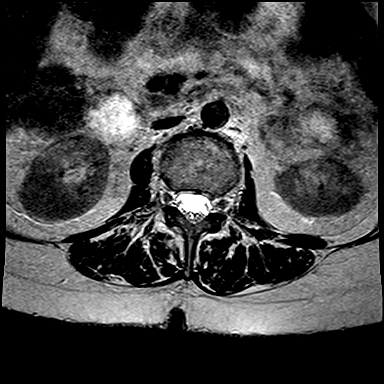
[im 23/23]
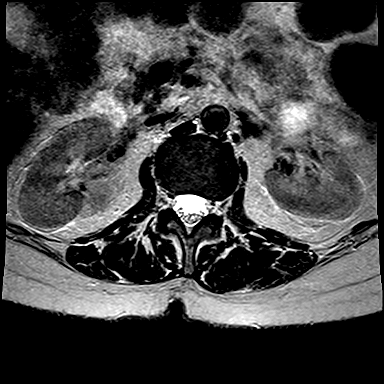

[Series 12: T1 · axial · 4.0mm · 0.52mm/px · z∈[-153,-32]mm · 5 of 23 slices shown (2 of 2)]
[im 1/23]
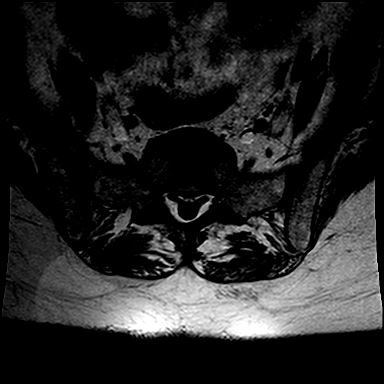
[im 3/23]
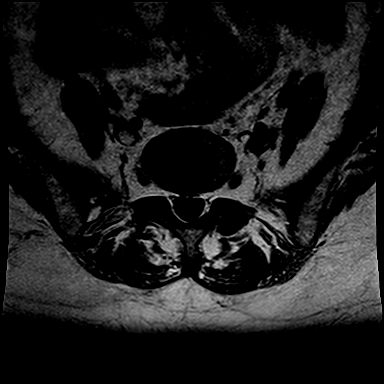
[im 8/23]
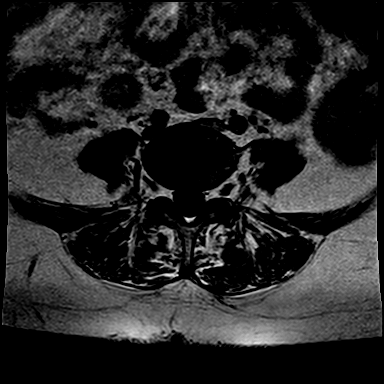
[im 10/23]
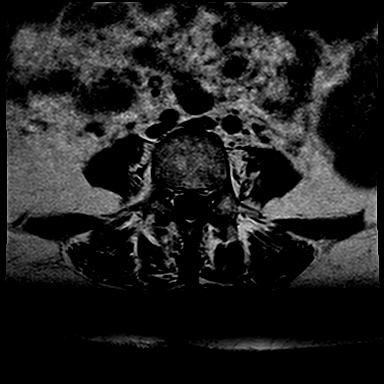
[im 13/23]
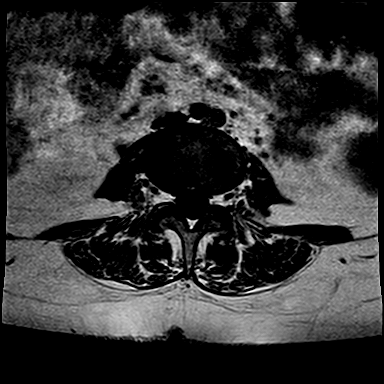

[Series 13: T2 · coronal · 5.0mm · 0.82mm/px · 8 of 18 slices shown (3 of 3)]
[im 1/18]
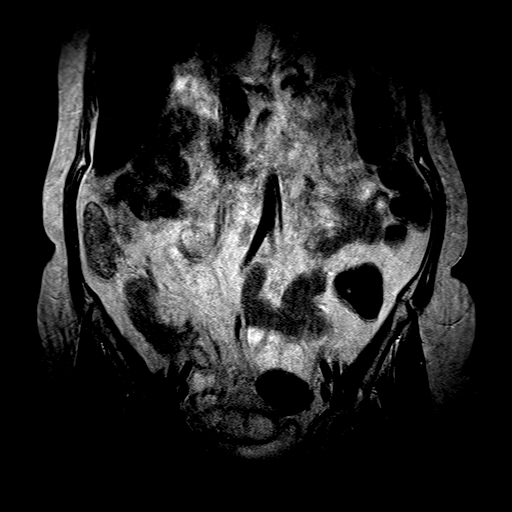
[im 3/18]
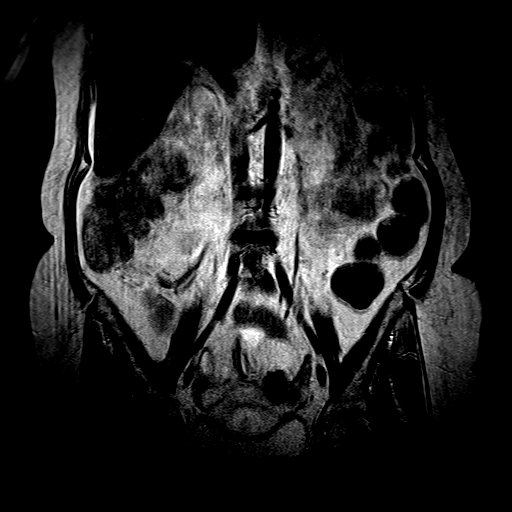
[im 5/18]
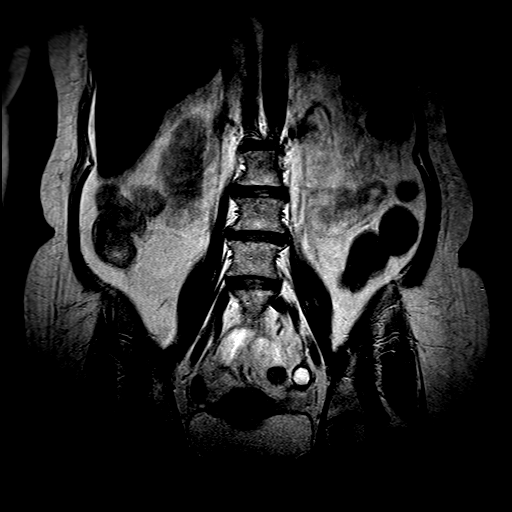
[im 8/18]
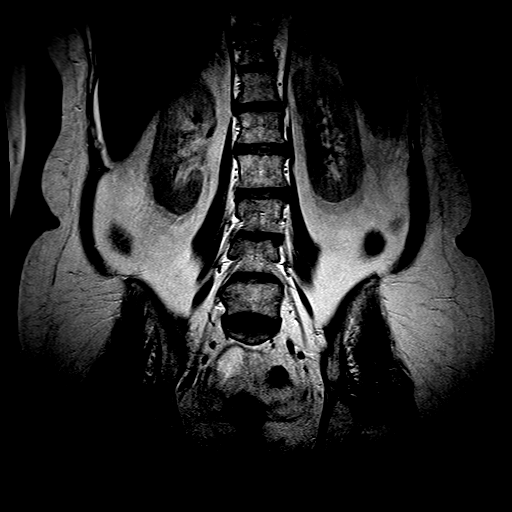
[im 10/18]
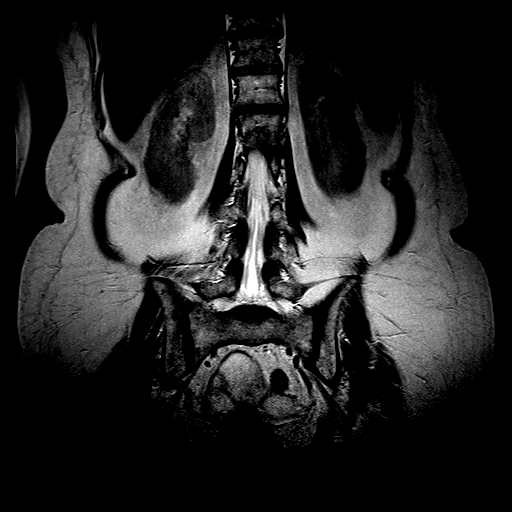
[im 13/18]
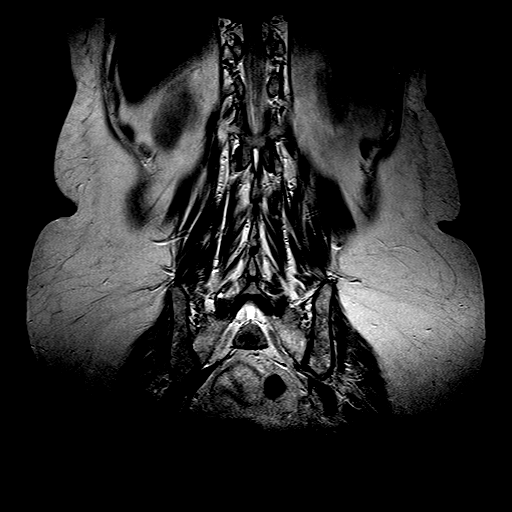
[im 15/18]
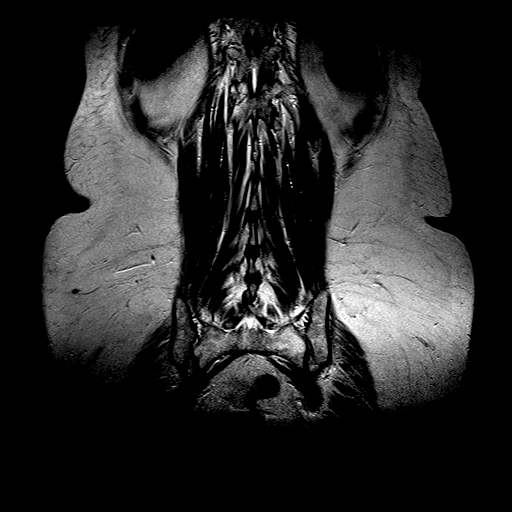
[im 18/18]
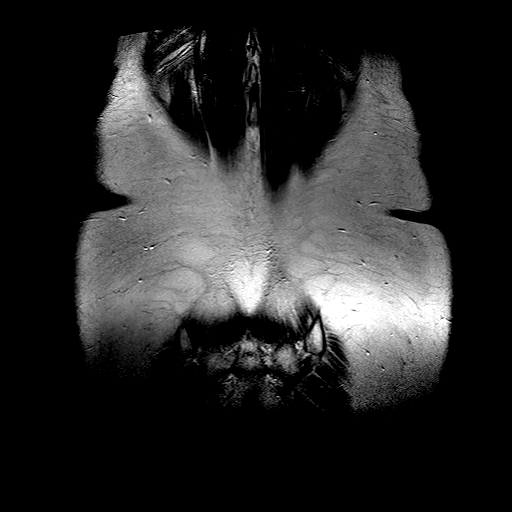

[34 of 48 positions shown; findings below may reference images not displayed]

FINDINGS: There are mild degenerative changes at multiple levels.  

There is no disc herniation or significant disc protrusion.  

There is no fracture, malalignment, significant marrow signal alteration, spinal stenosis, or abnormality of the conus.
IMPRESSION: 1. Mild degenerative changes.

2. No fracture or disc herniation is seen.

## 2022-05-06 ENCOUNTER — Telehealth (INDEPENDENT_AMBULATORY_CARE_PROVIDER_SITE_OTHER): Payer: Self-pay | Admitting: MEDICAL ONCOLOGY

## 2022-05-06 NOTE — Telephone Encounter (Signed)
Spoke with pt regarding rescheduling her appt to 07-06-22 @ 9:30.

## 2022-05-11 ENCOUNTER — Ambulatory Visit (INDEPENDENT_AMBULATORY_CARE_PROVIDER_SITE_OTHER): Payer: Self-pay | Admitting: MEDICAL ONCOLOGY

## 2022-05-11 ENCOUNTER — Other Ambulatory Visit (INDEPENDENT_AMBULATORY_CARE_PROVIDER_SITE_OTHER): Payer: Self-pay | Admitting: MEDICAL ONCOLOGY

## 2022-05-11 MED ORDER — LETROZOLE 2.5 MG TABLET
2.5000 mg | ORAL_TABLET | Freq: Every day | ORAL | 1 refills | Status: DC
Start: 2022-05-11 — End: 2022-11-02

## 2022-05-11 NOTE — Telephone Encounter (Addendum)
Refill request sent to Dr. Alanson Puls.    Regarding: refill  ----- Message from Venia Minks sent at 05/11/2022 11:53 AM EDT -----  Pt needs a refill on:    Letrozole 2.5 mg    Preferred Pharmacy     [id number] [place] [place]   [id number]   Phone: [phone]: [phone]   Hours: Not open 24 hours

## 2022-06-03 ENCOUNTER — Ambulatory Visit (INDEPENDENT_AMBULATORY_CARE_PROVIDER_SITE_OTHER): Payer: Self-pay | Admitting: MEDICAL ONCOLOGY

## 2022-06-24 ENCOUNTER — Other Ambulatory Visit (INDEPENDENT_AMBULATORY_CARE_PROVIDER_SITE_OTHER): Payer: Self-pay | Admitting: MEDICAL ONCOLOGY

## 2022-06-24 DIAGNOSIS — C50911 Malignant neoplasm of unspecified site of right female breast: Secondary | ICD-10-CM

## 2022-07-06 ENCOUNTER — Other Ambulatory Visit: Payer: Self-pay

## 2022-07-06 ENCOUNTER — Encounter (INDEPENDENT_AMBULATORY_CARE_PROVIDER_SITE_OTHER): Payer: Self-pay | Admitting: MEDICAL ONCOLOGY

## 2022-07-06 ENCOUNTER — Other Ambulatory Visit (INDEPENDENT_AMBULATORY_CARE_PROVIDER_SITE_OTHER): Payer: Self-pay | Admitting: MEDICAL ONCOLOGY

## 2022-07-06 ENCOUNTER — Other Ambulatory Visit (HOSPITAL_COMMUNITY): Payer: Medicare Other

## 2022-07-06 ENCOUNTER — Ambulatory Visit: Payer: Medicare Other | Attending: MEDICAL ONCOLOGY | Admitting: MEDICAL ONCOLOGY

## 2022-07-06 VITALS — BP 124/63 | HR 103 | Temp 98.1°F | Ht 64.0 in | Wt 143.7 lb

## 2022-07-06 DIAGNOSIS — I10 Essential (primary) hypertension: Secondary | ICD-10-CM | POA: Insufficient documentation

## 2022-07-06 DIAGNOSIS — E039 Hypothyroidism, unspecified: Secondary | ICD-10-CM | POA: Insufficient documentation

## 2022-07-06 DIAGNOSIS — C50911 Malignant neoplasm of unspecified site of right female breast: Secondary | ICD-10-CM

## 2022-07-06 DIAGNOSIS — J449 Chronic obstructive pulmonary disease, unspecified: Secondary | ICD-10-CM | POA: Insufficient documentation

## 2022-07-06 DIAGNOSIS — C50919 Malignant neoplasm of unspecified site of unspecified female breast: Secondary | ICD-10-CM

## 2022-07-06 DIAGNOSIS — Z923 Personal history of irradiation: Secondary | ICD-10-CM | POA: Insufficient documentation

## 2022-07-06 DIAGNOSIS — M79604 Pain in right leg: Secondary | ICD-10-CM | POA: Insufficient documentation

## 2022-07-06 DIAGNOSIS — Z809 Family history of malignant neoplasm, unspecified: Secondary | ICD-10-CM | POA: Insufficient documentation

## 2022-07-06 DIAGNOSIS — Z79811 Long term (current) use of aromatase inhibitors: Secondary | ICD-10-CM | POA: Insufficient documentation

## 2022-07-06 DIAGNOSIS — Z08 Encounter for follow-up examination after completed treatment for malignant neoplasm: Secondary | ICD-10-CM | POA: Insufficient documentation

## 2022-07-06 DIAGNOSIS — K760 Fatty (change of) liver, not elsewhere classified: Secondary | ICD-10-CM | POA: Insufficient documentation

## 2022-07-06 DIAGNOSIS — Z853 Personal history of malignant neoplasm of breast: Secondary | ICD-10-CM | POA: Insufficient documentation

## 2022-07-06 DIAGNOSIS — Z9889 Other specified postprocedural states: Secondary | ICD-10-CM | POA: Insufficient documentation

## 2022-07-06 DIAGNOSIS — Z9011 Acquired absence of right breast and nipple: Secondary | ICD-10-CM | POA: Insufficient documentation

## 2022-07-06 DIAGNOSIS — E119 Type 2 diabetes mellitus without complications: Secondary | ICD-10-CM | POA: Insufficient documentation

## 2022-07-06 DIAGNOSIS — Z801 Family history of malignant neoplasm of trachea, bronchus and lung: Secondary | ICD-10-CM | POA: Insufficient documentation

## 2022-07-06 DIAGNOSIS — Z9884 Bariatric surgery status: Secondary | ICD-10-CM | POA: Insufficient documentation

## 2022-07-06 LAB — COMPREHENSIVE METABOLIC PANEL, NON-FASTING
ALBUMIN/GLOBULIN RATIO: 1.1 (ref 0.8–1.4)
ALBUMIN: 4.2 g/dL (ref 3.5–5.7)
ALKALINE PHOSPHATASE: 63 U/L (ref 34–104)
ALT (SGPT): 12 U/L (ref 7–52)
ANION GAP: 10 mmol/L (ref 4–13)
AST (SGOT): 14 U/L (ref 13–39)
BILIRUBIN TOTAL: 0.4 mg/dL (ref 0.3–1.2)
BUN/CREA RATIO: 26 — ABNORMAL HIGH (ref 6–22)
BUN: 24 mg/dL (ref 7–25)
CALCIUM, CORRECTED: 9.2 mg/dL (ref 8.9–10.8)
CALCIUM: 9.4 mg/dL (ref 8.6–10.3)
CHLORIDE: 106 mmol/L (ref 98–107)
CO2 TOTAL: 25 mmol/L (ref 21–31)
CREATININE: 0.94 mg/dL (ref 0.60–1.30)
ESTIMATED GFR: 68 mL/min/{1.73_m2} (ref >59)
GLOBULIN: 3.8 (ref 2.9–5.4)
GLUCOSE: 145 mg/dL — ABNORMAL HIGH (ref 74–109)
OSMOLALITY, CALCULATED: 288 mOsm/kg (ref 270–290)
POTASSIUM: 4.7 mmol/L (ref 3.5–5.1)
PROTEIN TOTAL: 8 g/dL (ref 6.4–8.9)
SODIUM: 141 mmol/L (ref 136–145)

## 2022-07-06 LAB — CBC WITH DIFF
BASOPHIL #: 0 10*3/uL (ref 0.00–0.10)
BASOPHIL %: 0 % (ref 0–1)
EOSINOPHIL #: 1.3 10*3/uL — ABNORMAL HIGH (ref 0.00–0.50)
EOSINOPHIL %: 14 % — ABNORMAL HIGH
HCT: 39.9 % (ref 31.2–41.9)
HGB: 13.1 g/dL (ref 10.9–14.3)
LYMPHOCYTE #: 3.1 10*3/uL — ABNORMAL HIGH (ref 1.00–3.00)
LYMPHOCYTE %: 33 % (ref 16–44)
MCH: 29.7 pg (ref 24.7–32.8)
MCHC: 33 g/dL (ref 32.3–35.6)
MCV: 90.2 fL (ref 75.5–95.3)
MONOCYTE #: 0.6 10*3/uL (ref 0.30–1.00)
MONOCYTE %: 7 % (ref 5–13)
MPV: 8.9 fL (ref 7.9–10.8)
NEUTROPHIL #: 4.5 10*3/uL (ref 1.85–7.80)
NEUTROPHIL %: 47 % (ref 43–77)
PLATELETS: 288 10*3/uL (ref 140–440)
RBC: 4.42 10*6/uL (ref 3.63–4.92)
RDW: 16 % (ref 12.3–17.7)
WBC: 9.6 10*3/uL (ref 3.8–11.8)

## 2022-07-06 MED ORDER — ALENDRONATE 70 MG TABLET
70.0000 mg | ORAL_TABLET | ORAL | 0 refills | Status: DC
Start: 2022-07-06 — End: 2022-07-28

## 2022-07-06 NOTE — Progress Notes (Signed)
Department of Hematology/Oncology  Return Patient Visit       Jasmine Bates  [id number]  Oct 18, 1955   12/01/2021    DIAGNOSIS: Breast cancer.    HISTORY OF PRESENT ILLNESS:  Jasmine Bates is a 66 y.o. female who presents to today alone for evaluation of Breast cancer. Patient was switched to [place] on 06/04/21 due to complaints of excessive hot flashes but did not decrease the hot flashes. She requested to resume Letrozole on 12/01/21.  Denies fever, chills, diaphoresis, night sweats, and malaise. No infections during the interim.     REVIEW OF SYSTEMS:  Negative except for what is addressed in HPI.     Past Medical History:   Diagnosis Date    BRCA1 gene mutation negative     BRCA2 gene mutation negative     COPD (chronic obstructive pulmonary disease) (CMS HCC)     Diabetes mellitus, type 2 (CMS HCC)     Essential hypertension     Fatty liver     Hyperlipidemia     Hypothyroidism     Malignant neoplasm of breast (CMS [place]) 12/01/2021    Osteoarthritis     Piriformis syndrome     Sacroiliitis (CMS HCC)      Past Surgical History:   Procedure Laterality Date    CESAREAN SECTION      HX BREAST LUMPECTOMY Right     10/2018 positive  pt had radiation    HX GASTRIC SLEEVE       Social History     Socioeconomic History    Marital status: Married     Spouse name: Not on file    Number of children: Not on file    Years of education: Not on file    Highest education level: Not on file   Occupational History    Not on file   Tobacco Use    Smoking status: Never    Smokeless tobacco: Never   Vaping Use    Vaping Use: Never used   Substance and Sexual Activity    Alcohol use: Never    Drug use: Never    Sexual activity: Not on file   Other Topics Concern    Not on file   Social History Narrative    Not on file     Social Determinants of Health     Financial Resource Strain: Not on file   Transportation Needs: Not on file   Social Connections: Not on file   Intimate Partner Violence: Not on file   Housing Stability: Not on  file     Social History     Social History Narrative    Not on file     Social History     Substance and Sexual Activity   Drug Use Never     Family Medical History:       Problem Relation (Age of Onset)    Hypertension (High Blood Pressure) Father    Lung Cancer Mother    Melanoma Mother    No Known Problems Sister, Brother, Maternal Grandmother, Maternal Grandfather, Paternal Grandmother, Paternal Grandfather, Daughter, Son, Maternal Aunt, Maternal Uncle, Paternal Aunt, Paternal Uncle, Other          Current Outpatient Medications   Medication Sig    acarbose (PRECOSE) 25 mg Oral Tablet     albuterol sulfate (PROVENTIL OR VENTOLIN OR PROAIR) 90 mcg/actuation Inhalation oral inhaler Take 1-2 Puffs by inhalation    anastrozole (ARIMIDEX) 1 mg Oral Tablet Take  1 Tablet (1 mg total) by mouth Once a day    atorvastatin (LIPITOR) 10 mg Oral Tablet     BD UF NANO PEN NEEDLE 32 gauge x 5/32" Needle USE AS DIRECTED WITH INSULIN PEN    biotin 1 mg Oral Capsule Take by mouth    bisacodyL (DULCOLAX) 5 mg Oral Tablet, Delayed Release (E.C.) Take 1 Tablet (5 mg total) by mouth    budesonide-formoteroL (SYMBICORT) 160-4.5 mcg/actuation Inhalation oral inhaler     cetirizine (ZYRTEC) 10 mg Oral Tablet Take 1 Tablet (10 mg total) by mouth Once a day    Cholecalciferol, Vitamin D3, 50 mcg (2,000 unit) Oral Capsule Take 1 Capsule by mouth    cranberry fruit extract (CRANBERRY POWDER ORAL) Take 1 Tablet by mouth    ergocalciferol, vitamin D2, (DRISDOL) 1,250 mcg (50,000 unit) Oral Capsule     Fesoterodine 4 mg Oral Tablet Sustained Release 24 hr Take 1 Tablet (4 mg total) by mouth Once a day    FREESTYLE LIBRE 14 DAY SENSOR Does not apply Kit USE TO CHECK BLOOD SUGAR. CHANGE SENSOR EVERY 14 DAYS.    FREESTYLE LIBRE 3 SENSOR Does not apply Device APPLY EVERY 14 DAYS    glimepiride (AMARYL) 4 mg Oral Tablet     LANTUS SOLOSTAR U-100 INSULIN 100 unit/mL (3 mL) Subcutaneous Insulin Pen INJECT 30 UNITS INTO THE SKIN NIGHTLY    letrozole  (FEMARA) 2.5 mg Oral Tablet Take 1 Tablet (2.5 mg total) by mouth Once a day for 180 days    levothyroxine (SYNTHROID) 100 mcg Oral Tablet     lisinopriL (PRINIVIL) 20 mg Oral Tablet     metFORMIN (GLUCOPHAGE XR) 500 mg Oral Tablet Sustained Release 24 hr TAKE 2 TABLETS BY MOUTH 2 TIMES DAILY (WITH MEALS)    montelukast (SINGULAIR) 10 mg Oral Tablet     MYRBETRIQ 25 mg Oral Tablet Sustained Release 24 hr     ondansetron (ZOFRAN ODT) 4 mg Oral Tablet, Rapid Dissolve     OSPHENA 60 mg Oral Tablet Take 1 Tablet (60 mg total) by mouth Once a day    OZEMPIC 0.25 mg or 0.5 mg(2 mg/1.5 mL) Subcutaneous Pen Injector 0.5 mg    pantoprazole (PROTONIX) 40 mg Oral Tablet, Delayed Release (E.C.)     pioglitazone (ACTOS) 30 mg Oral Tablet     RYBELSUS 7 mg Oral Tablet     sertraline (ZOLOFT) 100 mg Oral Tablet Take 1 Tablet (100 mg total) by mouth Once a day    sertraline (ZOLOFT) 50 mg Oral Tablet     SUMAtriptan (IMITREX) 50 mg Oral Tablet     turmeric-ginger-black pepper 125 mg-6 mg- 50 mcg Oral Tablet, Chewable Chew 1 Tablet    verapamiL (VERELAN PM) 300 mg Oral Capsule, 24hr ER Pellet CT      Allergies   Allergen Reactions    Nsaids (Non-Steroidal Anti-Inflammatory Drug) Anaphylaxis    Sulfamethoxazole-Trimethoprim Angioedema and Rash    Aspirin     Sulfa (Sulfonamides)      VITAL SIGNS:  Ht 1.626 m (_0 )   BMI 24.89 kg/m   ECOG Status: (0) Fully active, able to carry on all predisease performance without restriction.    Physical Exam  Vitals and nursing note reviewed.   Constitutional:       Appearance: Normal appearance.   HENT:      Head: Normocephalic and atraumatic.      Mouth/Throat:      Mouth: Mucous membranes are moist.  Tongue: Tongue does not deviate from midline.   Eyes:      General: No scleral icterus.     Extraocular Movements: Extraocular movements intact.      Conjunctiva/sclera: Conjunctivae normal.      Pupils: Pupils are equal, round, and reactive to light.   Cardiovascular:      Rate and Rhythm:  Normal rate and regular rhythm.      Heart sounds: Normal heart sounds, S1 normal and S2 normal.   Pulmonary:      Effort: Pulmonary effort is normal.      Breath sounds: Normal breath sounds.   Chest:      Chest wall: No mass or tenderness.   Breasts:     Right: Normal. No inverted nipple, mass, nipple discharge, skin change or tenderness.      Left: Normal. No inverted nipple, mass, nipple discharge, skin change or tenderness.   Abdominal:      General: Abdomen is flat. Bowel sounds are normal.      Palpations: Abdomen is soft.      Tenderness: There is no abdominal tenderness.   Musculoskeletal:         General: Normal range of motion.      Cervical back: Normal range of motion and neck supple. No bony tenderness.      Thoracic back: No bony tenderness.      Lumbar back: No bony tenderness.   Lymphadenopathy:      Cervical: No cervical adenopathy.      Upper Body:      Right upper body: No supraclavicular or axillary adenopathy.      Left upper body: No supraclavicular or axillary adenopathy.   Skin:     General: Skin is warm and dry.   Neurological:      General: No focal deficit present.      Mental Status: She is alert and oriented to person, place, and time.      Sensory: Sensation is intact.      Motor: Motor function is intact.      Coordination: Coordination is intact.      Gait: Gait is intact.   Psychiatric:         Attention and Perception: Attention and perception normal.         Mood and Affect: Mood and affect normal.         Speech: Speech normal.         Behavior: Behavior normal. Behavior is cooperative.         Thought Content: Thought content normal.         Cognition and Memory: Cognition and memory normal.         Judgment: Judgment normal.      DIAGNOSTIC DATA:    Labs from 09-12-20: WBC 5.5, Hgb 14.1, Hct 42.8, plt 232 K, LFTs within normal limits, CA 27.29:  15.1    ASSESSMENT:  1. 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  A 12/22/2018 right breast core needle biopsy showed a  benign sclerosing lesion only .    She appears to remain in complete remission by H&P, labs, and imaging.  A 12/22/2021 bilateral screening mammogram was benign.  A 12/22/2021 DEXA scan showed osteopenia.    Note that patient is also on SERM, Ospemifene 60 mg PO daily. Will continue with Ca + Vit D and add Alendronate weekly.     2. 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 Right leg pain probably sciatica     4. Diabetes mellitus longstanding     5. Hypertension     6. Fatty liver from diabetes and  history of obesity      7. History of obesity status post gastric bypass surgery      8. COPD      9. Hypothyroidism     PLAN:   1. Continue with Letrozole 2.5 mg PO daily.  2. Due for next   3. Start Alendronate 70 mg PO weekly (further refills, per PCP).  4. Check CBC, CMP.  5. RTC 6 months with CBC, CMP, and B/L screening mammogram on 12/24/22.      Alene Mires was given the chance to ask questions, and these were answered to their satisfaction. The patient is welcome to call with any questions or concerns in the meantime.   This note was partially generated using MModal Fluency Direct system, and there may be some incorrect words, spellings, and punctuation that were not noted in checking the note before saving.   CC:  Peri Maris, DO  [place] [id number]

## 2022-07-07 NOTE — H&P (View-Only) (Signed)
Chronic Pain Management          Requesting provider:  Coralyn Pear, DO  [address]  [place],  [place] [address]    DOS: 07/15/2022    MRN #: [id number]    CHIEF COMPLAINT:  Pain:   Chief Complaint   Patient presents with   . New Patient   . Hip Pain         HISTORY OF PRESENT ILLNESS:    Referring Physician:    Coralyn Pear, DO  [address]  [place],  [place] [address]    Ms. Jasmine Bates is a 66 y.o. female referred to the [facility] for Consultation for evaluation and treatment of her low back and leg pain. She has low back pain across in lower axial spine. The pain radiates down left posterior thigh to posterior knee with numbness at times.     The pain interferes with quality of life and activities of daily living.     she has PMHx of DM2, asthma, hx of breast CA, migraines, HTN    Pain started 01/2022 .      She describes the pain level to be running at 6-8/10 on VAS most of the days     Exacerbating factors:sitting, lifting, bending and laying  Alleviating factors: changing positions, standing     Medications:  - Opioids:  - NSAIDs/Acetaminophen:   - antidepressants: zoloft  - Anticonvulsant/Membrane stabilizers:  - Muscle Relaxants: baclofen  - Other:     Physical Therapy:  Chiropractics 2-3x week for 3 months no relief stopped in Nov 2023  Acupuncture no relief    Pain Interventional Procedure:   none    Surgeries:   none in spine    SOAPP from initial visit  1. How often do you have mood swings? 3  2. How often do you smoke a cigarette within an hour after you awake? 0  3. How often have you taken medication other than the way it was prescribed? 0  4. How often have you used illegal drugs (example, marijuana, cocaine, etc.) in the past five years? 0  5. How often, in your lifetime, have you had legal problems or been arrested? 0     soapp = 3     Patient is disabled. Patient lives with their family.  Illicit drug use: No.  Psychiatric comorbid condition: No  Activity Level:  moderate        Patient denies any recent stroke/TIAs, weakness, bowel / bladder incontinence or any episode of loss of loss of consciousness.      Current Medications:  .  atorvastatin (LIPITOR) 80 mg Tablet  .  insulin NPH (NOVOLIN N /HUMULIN N) 100 unit/mL (3 mL) Insulin Pen  .  levothyroxine (SYNTHROID) 25 mcg Tablet  .  ondansetron HCL (ZOFRAN) 4 mg Tablet  .  semaglutide (OZEMPIC) 0.25 mg or 0.5 mg (2 mg/3 mL) Pen Injector  .  baclofen (LIORESAL) 10 mg Tablet  .  albuterol (PROVENTIL HFA) 90 mcg/actuation HFA Aerosol Inhaler  .  FREESTYLE LIBRE 3 SENSOR Device  .  cetirizine (ZYRTEC) 10 mg Tablet  .  ergocalciferol (ERGOCALCIFEROL) 1,250 mcg (50,000 unit) Capsule  .  glimepiride (AMARYL) 4 mg Tablet  .  lisinopriL (PRINIVIL) 20 mg Tablet  .  metFORMIN-XR (GLUCOPHAGE-XR) 500 mg Tablet Sustained Release 24HR  .  lisinopriL (PRINIVIL) 20 mg Tablet  .  montelukast (SINGULAIR) 10 mg Tablet  .  nitrofurantoin macrocrystaL (MACRODANTIN)  50 mg Capsule  .  ospemifene (OSPHENA) 60 mg Tablet  .  oxybutynin (DITROPAN) 5 mg Tablet  .  pantoprazole (PROTONIX) 40 mg Tablet, Delayed Release (E.C.)  .  BD NANO 2ND GEN PEN NEEDLE 32 gauge x 5/32" Needle  .  sertraline (ZOLOFT) 100 mg Tablet  .  SUMAtriptan 5 mg/actuation Spray, Non-Aerosol  .  Verapamil 300 mg Capsule, 24hr ER Pellet CT          ALLERGY:  Allergies   Allergen Reactions   . Aspirin Anaphylaxis   . Nsaids (Non-Steroidal Anti-Inflammatory Drug) Anaphylaxis   . Sulfamethoxazole-Trimethoprim Rash         PAST MEDICAL HISTORY:  Past Medical History:   Diagnosis Date   . Asthma    . Breast cancer (HCC)    . Diabetes mellitus (HCC)           PAST SURGICAL HISTORY:  History reviewed. No pertinent surgical history.      FAMILY HISTORY:  Family History   Problem Relation Name Age of Onset   . Cancer Mother     . Kidney Disease Father         SOCIAL HISTORY:  Social History     Socioeconomic History   . Marital status: Other     Spouse name: Not on file   . Number of  children: Not on file   . Years of education: Not on file   . Highest education level: Not on file   Occupational History   . Not on file   Tobacco Use   . Smoking status: Never   . Smokeless tobacco: Never   Substance and Sexual Activity   . Alcohol use: Never   . Drug use: Yes     Comment: THC gummies   . Sexual activity: Not on file   Other Topics Concern   . Not on file   Social History Narrative   . Not on file     Social Determinants of Health     Financial Resource Strain: Not on file   Food Insecurity: Not on file   Transportation Needs: Not on file   Physical Activity: Not on file   Stress: Not on file   Social Connections: Not on file   Intimate Partner Violence: Not on file   Housing Stability: Not on file           Review of Systems:     System Negative Positive Comments         Constitutional  x     Eyes  x     ENT  x     Cardiovascular x     Pulmonary x     Gastrointestinal x     Genitourinary x     Muscoloskeletal  x Muscle pain, joint pain, restricted movement    Skin x     Endocrine x     Neurological  x     Psychiatric x     Hematologic/Lymph x     Allergic/Immunologic x           EXAMINATION:  Vital Signs:  BP 126/60   Pulse 67   Resp 20   Ht 1.626 m (5\' 4" )   Wt 67.3 kg (148 lb 6.4 oz)   BMI 25.47 kg/m   Body mass index is 25.47 kg/m.    GENERAL:  Patient is an otherwise pleasant female.  Patient is awake, alert, cooperative, no apparent distress; Appropriate in conversation.  HEENT: Normocephalic, atraumatic. vision intact, hearing intact, supple   CVS: No Cyanosis, Negative bilateral LE edema   Respiratory:  Normal breathing, with no signs of distress  GI: -ve Ascites, -ve Jaundice   ABDOMEN:  Not examined.     SKIN:  WNL. Warm and dry. No lesions, apparent petechiae, bruises or rashes.    No signs of infection the in lumbar paraspinous region.   NEURO: CN II-XII are grossly intact.    Sensation is intact and symmetrical to light touch throughout bilateral lower extremities.     DTR's    +2 bilateral patellar and +2 bilateral achilles  SLR: positive on left in S1 dermatome    EXTREMITIES: No clubbing, cyanosis, or edema.     MUSCULOSKELETAL:    Gait: Ambulates without an assistive device. Gait is slightly antalgic favoring left    There was tenderness to palpation of the lumbar region.    Motor strength is 5/5 throughout all motor groups in bilateral lower extremities.     Lumbar Spine: limited ROM in flexion and extension.  positive facet loading.   paraspinal  tenderness bilaterally    Tenderness to GTB was neg.        ASSESSMENT:     Ms. Jasmine Bates is a 66 y.o. female referred to the [facility] for evaluation and treatment of her low back and leg pain    1. Radiculopathy of lumbosacral region      She has chronic low back and leg pain, positive SLR in left S1 distribution. Will get her MRI results and review, if concordant with her symptoms, will set her up with left paramedian L5-S1 ESI. The patient has failed treatment > 6 weeks with chiropractics, medications, time and rest.  The patient is good candidate for L5-S1 ESI. Procedure discussed including risks and side effects.          PLAN:  - [facility]  - HEP and LESI information  - PMP Reviewed, UDS reviewed if available  - All questions answered and patient voiced understanding  - Opioid management: SOAPP risk assessment is Negative. Initial risk stratification is low risk as well. I don't recommend narcotics as a long term treatment option, but may be indicated if patients have failed non opioid treatments and risks of opioid treatment outweigh risks.  I will not be able to prescribe any opioid medication, per our clinic policy, in the first visit.  - Warned the patient about possible red flags including developing focal weakness/paralysis, bowel or bladder incontinence/extension and advised him to report to the closest ER or to call 911 if any of these symptoms developed.  -  Discussed with patient about possible side effects of medications prescribed through our clinic, and how she will need to contact our clinic for any concern regarding her medications.    - The treatment will be a comprehensive, multimodal approach. The goal for patient to have improved physical function and quality of life. Each visit the plan of care will be tailored to focus on that goal.       - Patient advised to contact [facility] for any concern or exacerbation of sypmtoms.  - RTC after injection  - No opioid scripts given at this visit      Rica Koyanagi, MD  Pain Management  07/15/2022

## 2022-07-08 LAB — CA 27,29: CA 27.29: 14 U/mL (ref <38)

## 2022-07-25 ENCOUNTER — Other Ambulatory Visit (INDEPENDENT_AMBULATORY_CARE_PROVIDER_SITE_OTHER): Payer: Self-pay | Admitting: MEDICAL ONCOLOGY

## 2022-08-11 NOTE — Interval H&P Note (Signed)
H&P update  The Patient denies interim Illnes or injury.  Pt states there are no changes to medications or allergies.  patient denies any contraindications to the procedure including: Current fever, Infection, Anticoagulation, or bleeding from any area in body.   HPI and exam were reviewed and confirmed.   CV: RRR  Resp: no distress, CTAB    Relevant imaging reviewed    Vital Signs:  Blood pressure 131/62, pulse 60, temperature 97 F (36.1 C), resp. rate 20, height 1.626 m (5\' 4" ), weight 66.7 kg (147 lb), SpO2 98%.  The (Operative site)  low back was marked  There have been no changes in the history, physical exam or indications for procedure  Consent:  Signed, on Chart  Rica Koyanagi, MD 08/11/2022 12:23 PM

## 2022-11-01 ENCOUNTER — Other Ambulatory Visit (INDEPENDENT_AMBULATORY_CARE_PROVIDER_SITE_OTHER): Payer: Self-pay | Admitting: MEDICAL ONCOLOGY

## 2022-12-18 LAB — ES ONCOTYPE DX BREAST RECURRENCE SCORE

## 2022-12-25 ENCOUNTER — Ambulatory Visit (HOSPITAL_COMMUNITY): Payer: Self-pay

## 2022-12-29 ENCOUNTER — Telehealth (INDEPENDENT_AMBULATORY_CARE_PROVIDER_SITE_OTHER): Payer: Self-pay | Admitting: NURSE PRACTITIONER

## 2022-12-29 NOTE — Telephone Encounter (Signed)
Spoke to pt confirmed mammo appt on 12-31-22 @ 3:30

## 2022-12-31 ENCOUNTER — Inpatient Hospital Stay
Admission: RE | Admit: 2022-12-31 | Discharge: 2022-12-31 | Disposition: A | Discharge status: Home / Self Care | Payer: Medicare Other | Source: Ambulatory Visit | Attending: MEDICAL ONCOLOGY | Admitting: MEDICAL ONCOLOGY

## 2022-12-31 ENCOUNTER — Other Ambulatory Visit: Payer: Self-pay

## 2022-12-31 ENCOUNTER — Encounter (HOSPITAL_COMMUNITY): Payer: Self-pay

## 2022-12-31 DIAGNOSIS — C50919 Malignant neoplasm of unspecified site of unspecified female breast: Secondary | ICD-10-CM | POA: Insufficient documentation

## 2022-12-31 DIAGNOSIS — Z1231 Encounter for screening mammogram for malignant neoplasm of breast: Secondary | ICD-10-CM | POA: Insufficient documentation

## 2023-01-05 ENCOUNTER — Ambulatory Visit (INDEPENDENT_AMBULATORY_CARE_PROVIDER_SITE_OTHER)
Admission: RE | Admit: 2023-01-05 | Discharge: 2023-01-05 | Disposition: A | Discharge status: Home / Self Care | Payer: Medicare Other | Source: Ambulatory Visit | Attending: NURSE PRACTITIONER | Admitting: NURSE PRACTITIONER

## 2023-01-05 ENCOUNTER — Ambulatory Visit: Payer: Medicare Other | Attending: NURSE PRACTITIONER | Admitting: NURSE PRACTITIONER

## 2023-01-05 ENCOUNTER — Other Ambulatory Visit (HOSPITAL_COMMUNITY): Payer: Medicare Other | Admitting: NURSE PRACTITIONER

## 2023-01-05 ENCOUNTER — Other Ambulatory Visit (INDEPENDENT_AMBULATORY_CARE_PROVIDER_SITE_OTHER): Payer: Self-pay

## 2023-01-05 ENCOUNTER — Encounter (INDEPENDENT_AMBULATORY_CARE_PROVIDER_SITE_OTHER): Payer: Self-pay | Admitting: NURSE PRACTITIONER

## 2023-01-05 ENCOUNTER — Other Ambulatory Visit: Payer: Self-pay

## 2023-01-05 VITALS — BP 130/57 | HR 67 | Temp 97.4°F | Ht 64.0 in | Wt 150.2 lb

## 2023-01-05 DIAGNOSIS — Z923 Personal history of irradiation: Secondary | ICD-10-CM | POA: Insufficient documentation

## 2023-01-05 DIAGNOSIS — C50911 Malignant neoplasm of unspecified site of right female breast: Secondary | ICD-10-CM | POA: Insufficient documentation

## 2023-01-05 DIAGNOSIS — Z08 Encounter for follow-up examination after completed treatment for malignant neoplasm: Secondary | ICD-10-CM | POA: Insufficient documentation

## 2023-01-05 DIAGNOSIS — M79604 Pain in right leg: Secondary | ICD-10-CM | POA: Insufficient documentation

## 2023-01-05 DIAGNOSIS — Z86018 Personal history of other benign neoplasm: Secondary | ICD-10-CM | POA: Insufficient documentation

## 2023-01-05 DIAGNOSIS — M8580 Other specified disorders of bone density and structure, unspecified site: Secondary | ICD-10-CM | POA: Insufficient documentation

## 2023-01-05 DIAGNOSIS — H9190 Unspecified hearing loss, unspecified ear: Secondary | ICD-10-CM | POA: Insufficient documentation

## 2023-01-05 DIAGNOSIS — Z170 Estrogen receptor positive status [ER+]: Secondary | ICD-10-CM | POA: Insufficient documentation

## 2023-01-05 DIAGNOSIS — C50919 Malignant neoplasm of unspecified site of unspecified female breast: Secondary | ICD-10-CM

## 2023-01-05 DIAGNOSIS — Z853 Personal history of malignant neoplasm of breast: Secondary | ICD-10-CM | POA: Insufficient documentation

## 2023-01-05 DIAGNOSIS — Z9884 Bariatric surgery status: Secondary | ICD-10-CM | POA: Insufficient documentation

## 2023-01-05 DIAGNOSIS — Z9889 Other specified postprocedural states: Secondary | ICD-10-CM | POA: Insufficient documentation

## 2023-01-05 DIAGNOSIS — R110 Nausea: Secondary | ICD-10-CM | POA: Insufficient documentation

## 2023-01-05 DIAGNOSIS — Z79811 Long term (current) use of aromatase inhibitors: Secondary | ICD-10-CM | POA: Insufficient documentation

## 2023-01-05 DIAGNOSIS — R296 Repeated falls: Secondary | ICD-10-CM | POA: Insufficient documentation

## 2023-01-05 DIAGNOSIS — R42 Dizziness and giddiness: Secondary | ICD-10-CM | POA: Insufficient documentation

## 2023-01-05 LAB — COMPREHENSIVE METABOLIC PANEL, NON-FASTING
ALBUMIN/GLOBULIN RATIO: 1.2 (ref 0.8–1.4)
ALBUMIN: 3.9 g/dL (ref 3.5–5.7)
ALKALINE PHOSPHATASE: 63 U/L (ref 34–104)
ALT (SGPT): 14 U/L (ref 7–52)
ANION GAP: 6 mmol/L (ref 4–13)
AST (SGOT): 15 U/L (ref 13–39)
BILIRUBIN TOTAL: 0.4 mg/dL (ref 0.3–1.2)
BUN/CREA RATIO: 26 — ABNORMAL HIGH (ref 6–22)
BUN: 20 mg/dL (ref 7–25)
CALCIUM, CORRECTED: 9.6 mg/dL (ref 8.9–10.8)
CALCIUM: 9.5 mg/dL (ref 8.6–10.3)
CHLORIDE: 107 mmol/L (ref 98–107)
CO2 TOTAL: 27 mmol/L (ref 21–31)
CREATININE: 0.76 mg/dL (ref 0.60–1.30)
ESTIMATED GFR: 87 mL/min/{1.73_m2} (ref >59)
GLOBULIN: 3.3 (ref 2.9–5.4)
GLUCOSE: 130 mg/dL — ABNORMAL HIGH (ref 74–109)
OSMOLALITY, CALCULATED: 284 mOsm/kg (ref 270–290)
POTASSIUM: 4.6 mmol/L (ref 3.5–5.1)
PROTEIN TOTAL: 7.2 g/dL (ref 6.4–8.9)
SODIUM: 140 mmol/L (ref 136–145)

## 2023-01-05 LAB — CBC WITH DIFF
BASOPHIL #: 0.1 10*3/uL (ref 0.00–0.10)
BASOPHIL %: 1 % (ref 0–1)
EOSINOPHIL #: 0.7 10*3/uL — ABNORMAL HIGH (ref 0.00–0.50)
EOSINOPHIL %: 9 % — ABNORMAL HIGH
HCT: 38.4 % (ref 31.2–41.9)
HGB: 12.8 g/dL (ref 10.9–14.3)
LYMPHOCYTE #: 3.2 10*3/uL — ABNORMAL HIGH (ref 1.00–3.00)
LYMPHOCYTE %: 38 % (ref 16–44)
MCH: 30.1 pg (ref 24.7–32.8)
MCHC: 33.3 g/dL (ref 32.3–35.6)
MCV: 90.5 fL (ref 75.5–95.3)
MONOCYTE #: 0.5 10*3/uL (ref 0.30–1.00)
MONOCYTE %: 6 % (ref 5–13)
MPV: 9.6 fL (ref 7.9–10.8)
NEUTROPHIL #: 3.8 10*3/uL (ref 1.85–7.80)
NEUTROPHIL %: 46 % (ref 43–77)
PLATELETS: 260 10*3/uL (ref 140–440)
RBC: 4.24 10*6/uL (ref 3.63–4.92)
RDW: 14.7 % (ref 12.3–17.7)
WBC: 8.3 10*3/uL (ref 3.8–11.8)

## 2023-01-05 NOTE — Cancer Center Note (Signed)
Department of Hematology/Oncology  Return Patient Visit       Jasmine Bates  [id number]  [birth year]   12/01/2021    DIAGNOSIS: Breast cancer.    HISTORY OF PRESENT ILLNESS:  Jasmine Bates is a 67 y.o. female who presents to today  for evaluation of Breast cancer. Patient was switched to Anastrazole on 06/04/21 due to complaints of excessive hot flashes but did not decrease the hot flashes. She requested to resume Letrozole on 12/01/21.  Denies fever, chills, diaphoresis, night sweats, and malaise. No infections during the interim. She was  under the care of [facility], with an epidural January of 2024 . Ongoing falls with feeling like her legs can't support her weight, with dizziness upon rising. Falls within the last 12 months 4-5 total.  The falls are about same post epidural injection.     REVIEW OF SYSTEMS:  Review of Systems   Constitutional:  Negative for appetite change, chills, fatigue and fever.   Eyes:  Positive for eye problems (burn and itch).   Respiratory:  Negative for cough and shortness of breath.    Cardiovascular:  Negative for chest pain, leg swelling and palpitations.   Gastrointestinal:  Positive for nausea (3-4 times per week thought to be realted to Metformin). Negative for abdominal pain, blood in stool and diarrhea.   Endocrine: Positive for hot flashes (improving).   Genitourinary:  Negative for difficulty urinating and dyspareunia.    Musculoskeletal:  Positive for back pain.        Tail bone with fracture from fall in April and last week    Skin: Negative.    Neurological:  Positive for dizziness (feeling she is spinning when getting up quickly) and extremity weakness.   Hematological:  Negative for adenopathy. Does not bruise/bleed easily.   Psychiatric/Behavioral:  Positive for sleep disturbance (trouble falling asleep at night). Negative for depression. The patient is not nervous/anxious.          Past Medical History:   Diagnosis Date    BRCA1 gene mutation negative     BRCA2  gene mutation negative     Breast CA (CMS HCC) 2020    right lumpectomy/radiation/arimadex    COPD (chronic obstructive pulmonary disease) (CMS HCC)     Diabetes mellitus, type 2 (CMS HCC)     Essential hypertension     Fatty liver     Hyperlipidemia     Hypothyroidism     Malignant neoplasm of breast (CMS HCC) 12/01/2021    Osteoarthritis     Piriformis syndrome     Sacroiliitis (CMS HCC)      Past Surgical History:   Procedure Laterality Date    CESAREAN SECTION      HX BREAST LUMPECTOMY Right     10/2018 positive  pt had radiation    HX GASTRIC SLEEVE       Social History     Socioeconomic History    Marital status: Married     Spouse name: Not on file    Number of children: Not on file    Years of education: Not on file    Highest education level: Not on file   Occupational History    Not on file   Tobacco Use    Smoking status: Never    Smokeless tobacco: Never   Vaping Use    Vaping status: Never Used   Substance and Sexual Activity    Alcohol use: Never    Drug use:  Never    Sexual activity: Not on file   Other Topics Concern    Not on file   Social History Narrative    Not on file     Social Determinants of Health     Financial Resource Strain: Not on file   Transportation Needs: Not on file   Social Connections: Not on file   Intimate Partner Violence: Not on file   Housing Stability: Not on file     Social History     Social History Narrative    Not on file     Social History     Substance and Sexual Activity   Drug Use Never     Family Medical History:       Problem Relation (Age of Onset)    Hypertension (High Blood Pressure) Father    Lung Cancer Mother    Melanoma Mother    No Known Problems Sister, Brother, Maternal Grandmother, Maternal Grandfather, Paternal Grandmother, Paternal Grandfather, Daughter, Son, Maternal Aunt, Maternal Uncle, Paternal Aunt, Paternal Uncle, Other          Current Outpatient Medications   Medication Sig    albuterol sulfate (PROVENTIL OR VENTOLIN OR PROAIR) 90 mcg/actuation  Inhalation oral inhaler Take 1-2 Puffs by inhalation    alendronate (FOSAMAX) 70 mg Oral Tablet TAKE 1 TABLET (70 MG TOTAL) BY MOUTH EVERY 7 DAYS    atorvastatin (LIPITOR) 80 mg Oral Tablet Take 1 Tablet (80 mg total) by mouth Once a day    BD UF NANO PEN NEEDLE 32 gauge x 5/32" Needle USE AS DIRECTED WITH INSULIN PEN    biotin 1 mg Oral Capsule Take by mouth    bisacodyL (DULCOLAX) 5 mg Oral Tablet, Delayed Release (E.C.) Take 1 Tablet (5 mg total) by mouth    cetirizine (ZYRTEC) 10 mg Oral Tablet Take 1 Tablet (10 mg total) by mouth Once a day    Cholecalciferol, Vitamin D3, 50 mcg (2,000 unit) Oral Capsule Take 1 Capsule by mouth    cranberry fruit extract (CRANBERRY POWDER ORAL) Take 1 Tablet by mouth    ergocalciferol, vitamin D2, (DRISDOL) 1,250 mcg (50,000 unit) Oral Capsule     FREESTYLE LIBRE 3 SENSOR Does not apply Device APPLY EVERY 14 DAYS    glimepiride (AMARYL) 4 mg Oral Tablet     letrozole (FEMARA) 2.5 mg Oral Tablet TAKE 1 TABLET (2.5 MG TOTAL) BY MOUTH ONCE A DAY FOR 180 DAYS    levothyroxine (SYNTHROID) 100 mcg Oral Tablet     lisinopriL (PRINIVIL) 20 mg Oral Tablet     metFORMIN (GLUCOPHAGE XR) 500 mg Oral Tablet Sustained Release 24 hr TAKE 2 TABLETS BY MOUTH 2 TIMES DAILY (WITH MEALS)    montelukast (SINGULAIR) 10 mg Oral Tablet     NOVOLIN N FLEXPEN 100 unit/mL (3 mL) Subcutaneous Insulin Pen subcutaneous pen     ondansetron (ZOFRAN ODT) 4 mg Oral Tablet, Rapid Dissolve     OSPHENA 60 mg Oral Tablet Take 1 Tablet (60 mg total) by mouth Once a day    oxyBUTYnin (DITROPAN XL) 5 mg Oral Tablet Extended Rel 24 hr Take 1 Tablet (5 mg total) by mouth Once a day    OZEMPIC 0.25 mg or 0.5 mg(2 mg/1.5 mL) Subcutaneous Pen Injector 0.5 mg    pantoprazole (PROTONIX) 40 mg Oral Tablet, Delayed Release (E.C.)     pioglitazone (ACTOS) 30 mg Oral Tablet     sertraline (ZOLOFT) 100 mg Oral Tablet Take 1 Tablet (100 mg  total) by mouth Once a day    sertraline (ZOLOFT) 50 mg Oral Tablet     SUMAtriptan  (IMITREX) 50 mg Oral Tablet     verapamiL (VERELAN PM) 300 mg Oral Capsule, 24hr ER Pellet CT      Allergies   Allergen Reactions    Nsaids (Non-Steroidal Anti-Inflammatory Drug) Anaphylaxis    Sulfamethoxazole-Trimethoprim Angioedema and Rash    Aspirin     Sulfa (Sulfonamides)      VITAL SIGNS:  BP (!) 130/57 (Site: Left, Patient Position: Sitting, Cuff Size: Adult)   Pulse 67   Temp 36.3 C (97.4 F) (Temporal)   Ht 1.626 m (5\' 4" )   Wt 68.1 kg (150 lb 3.2 oz)   SpO2 99%   BMI 25.78 kg/m   ECOG Status: (0) Fully active, able to carry on all predisease performance without restriction.    Physical Exam  Vitals and nursing note reviewed.   Constitutional:       Appearance: Normal appearance.   HENT:      Head: Normocephalic and atraumatic.      Right Ear: Tympanic membrane, ear canal and external ear normal. There is no impacted cerumen.      Left Ear: Tympanic membrane, ear canal and external ear normal. There is no impacted cerumen.      Nose: Nose normal.      Mouth/Throat:      Mouth: Mucous membranes are moist.      Tongue: Tongue does not deviate from midline.   Eyes:      General: No scleral icterus.        Right eye: No discharge.         Left eye: No discharge.      Extraocular Movements: Extraocular movements intact.      Conjunctiva/sclera: Conjunctivae normal.      Pupils: Pupils are equal, round, and reactive to light.   Cardiovascular:      Rate and Rhythm: Normal rate and regular rhythm.      Heart sounds: Normal heart sounds, S1 normal and S2 normal. No murmur heard.     No friction rub.   Pulmonary:      Effort: Pulmonary effort is normal.      Breath sounds: Normal breath sounds. No wheezing or rhonchi.   Chest:      Chest wall: No mass or tenderness.   Breasts:     Right: Normal. No inverted nipple, mass, nipple discharge, skin change or tenderness.      Left: Normal. No inverted nipple, mass, nipple discharge, skin change or tenderness.   Abdominal:      General: Bowel sounds are normal.       Palpations: Abdomen is soft.      Tenderness: There is no abdominal tenderness.   Musculoskeletal:         General: No tenderness or deformity. Normal range of motion.      Cervical back: Normal range of motion and neck supple. No bony tenderness.      Thoracic back: No bony tenderness.      Lumbar back: No bony tenderness.      Right lower leg: No edema.      Left lower leg: No edema.   Lymphadenopathy:      Cervical: No cervical adenopathy.      Upper Body:      Right upper body: No supraclavicular or axillary adenopathy.      Left upper body: No supraclavicular or axillary adenopathy.  Skin:     General: Skin is warm and dry.      Capillary Refill: Capillary refill takes 2 to 3 seconds.      Findings: No bruising or erythema.   Neurological:      General: No focal deficit present.      Mental Status: She is alert and oriented to person, place, and time.      Sensory: Sensation is intact.      Motor: Motor function is intact. No weakness.      Coordination: Coordination is intact. Coordination normal.      Gait: Gait is intact. Gait normal.      Comments: Negative for Nystagmus    Psychiatric:         Attention and Perception: Attention and perception normal.         Mood and Affect: Mood and affect normal.         Speech: Speech normal.         Behavior: Behavior normal. Behavior is cooperative.         Thought Content: Thought content normal.         Cognition and Memory: Cognition and memory normal.         Judgment: Judgment normal.      DIAGNOSTIC DATA:    LABS  CBC  Diff   Lab Results   Component Value Date/Time    WBC 8.3 01/05/2023 10:53 AM    HGB 12.8 01/05/2023 10:53 AM    HCT 38.4 01/05/2023 10:53 AM    PLTCNT 260 01/05/2023 10:53 AM    RBC 4.24 01/05/2023 10:53 AM    MCV 90.5 01/05/2023 10:53 AM    MCHC 33.3 01/05/2023 10:53 AM    MCH 30.1 01/05/2023 10:53 AM    RDW 14.7 01/05/2023 10:53 AM    MPV 9.6 01/05/2023 10:53 AM    Lab Results   Component Value Date/Time    PMNS 46 01/05/2023 10:53 AM     LYMPHOCYTES 38 01/05/2023 10:53 AM    EOSINOPHIL 9 (H) 01/05/2023 10:53 AM    MONOCYTES 6 01/05/2023 10:53 AM    BASOPHILS 1 01/05/2023 10:53 AM    BASOPHILS 0.10 01/05/2023 10:53 AM    PMNABS 3.80 01/05/2023 10:53 AM    LYMPHSABS 3.20 (H) 01/05/2023 10:53 AM    EOSABS 0.70 (H) 01/05/2023 10:53 AM    MONOSABS 0.50 01/05/2023 10:53 AM            Comprehensive Metabolic Profile    Lab Results   Component Value Date    SODIUM 140 01/05/2023    POTASSIUM 4.6 01/05/2023    CHLORIDE 107 01/05/2023    CO2 27 01/05/2023    ANIONGAP 6 01/05/2023    BUN 20 01/05/2023    CREATININE 0.76 01/05/2023    ALBUMIN 3.9 01/05/2023    CALCIUM 9.5 01/05/2023    GLUCOSENF 130 (H) 01/05/2023    ALKPHOS 63 01/05/2023    ALT 14 01/05/2023    AST 15 01/05/2023    TOTBILIRUBIN 0.4 01/05/2023    TOTALPROTEIN 7.2 01/05/2023         ESTIMATED GFR   Date Value Ref Range Status   01/05/2023 87 >59 mL/min/1.36m^2 Final     CA 27.29   Date Value Ref Range Status   07/06/2022 14 <38 U/mL Final     Comment:        This test was performed using the Siemens chemilumi-  nescent method. Values obtained from different assay  methods cannot be used interchangeably. CA27.29  levels, regardless of value, should not be interpreted  as absolute evidence of the presence or absence of  disease.              Labs from 09-12-20: WBC 5.5, Hgb 14.1, Hct 42.8, plt 232 K, LFTs within normal limits, CA 27.29:  15.1    Pathology:  None to Review today    Radiology:    12/31/2022 Mammogram Bilateral   Bilateral craniocaudal and mediolateral oblique views were obtained.   BI-RADS category C-heterogeneously dense. Postoperative and postradiation changes are again identified in the right breast. No new suspicious mass, asymmetric density, or area of architectural distortion is identified. No suspicious groups of microcalcifications are seen. No new skin thickening or nipple retraction is identified.   Additional tomosynthesis images of both breasts in multiple projections  were obtained. No additional suspicious abnormality was noted.    Computer-aided detection was also utilized in the evaluation of this study. The computer detected no additional suspicious abnormality.   IMPRESSION:  No findings suspicious for new or recurrent carcinoma identified on the current examination. Follow-up bilateral screening mammography in 1 year is recommended.         ASSESSMENT:  1. 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  A 12/22/2018 right breast core needle biopsy showed a benign sclerosing lesion only .    She appears to remain in complete remission by H&P, labs, and imaging.  A 12/22/2021 bilateral screening mammogram was benign.        Osteopenia- A 12/22/2021 DEXA scan showed osteopenia.  Note that patient is also on SERM, Ospemifene 60 mg PO daily. Will continue with Ca + Vit D and add Alendronate weekly.     2. 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 Right leg pain probably sciatica- previously under care of [facility]      4. History of obesity status post gastric bypass surgery        PLAN:  All relative external and internal medical records were reviewed including available H&Ps, progress notes, procedure notes, imaging's, laboratories, and pathology       1. Continue with Letrozole 2.5 mg PO daily.  2. Due for next Dexa Scan after 12/23/23   3. MRI of Brain for ongoing dizziness with frequent falls and nausea  4. Refer to [facility] and  balance clinic for declining hearing with frequent falls   3. Continue Alendronate 70 mg PO weekly (further refills, per PCP).  4. Check CBC, CMP. CA 27-29 today   5. RTC 6 months  with repeat labs as above CBC, CMP CA 27-29    The patient was given the opportunity to ask questions, and these were answered to their satisfaction. They are welcome to call with any questions or concerns at any point.      On the day of the encounter, I spent a total of  51 minutes on this patient encounter including review of  historical information, examination, documentation and post-visit activities.       Evie Lacks APRN, FNP-C 6/4/202411:57      CC:  Mariah Milling, DO  [address]  [place] [address]

## 2023-01-05 NOTE — Patient Instructions (Addendum)
Labs with next office Visit    MRI of Brain   Dexa Scan/Bone Density Scan  Referral to [facility]     Mammogram due in 2025 after 05/30th

## 2023-01-06 ENCOUNTER — Telehealth (INDEPENDENT_AMBULATORY_CARE_PROVIDER_SITE_OTHER): Payer: Self-pay | Admitting: NURSE PRACTITIONER

## 2023-01-06 NOTE — Telephone Encounter (Signed)
Called  patient with appt for [facility] for 02-23-23 10:00

## 2023-01-08 LAB — CA 27,29: CA 27.29: 18 U/mL (ref <38)

## 2023-01-25 ENCOUNTER — Encounter (INDEPENDENT_AMBULATORY_CARE_PROVIDER_SITE_OTHER): Payer: Self-pay | Admitting: NURSE PRACTITIONER

## 2023-01-25 ENCOUNTER — Ambulatory Visit
Admission: RE | Admit: 2023-01-25 | Discharge: 2023-01-25 | Disposition: A | Discharge status: Home / Self Care | Payer: Medicare Other | Source: Ambulatory Visit | Attending: NURSE PRACTITIONER | Admitting: NURSE PRACTITIONER

## 2023-01-25 ENCOUNTER — Ambulatory Visit (HOSPITAL_BASED_OUTPATIENT_CLINIC_OR_DEPARTMENT_OTHER)
Admission: RE | Admit: 2023-01-25 | Discharge: 2023-01-25 | Disposition: A | Discharge status: Home / Self Care | Payer: Medicare Other | Source: Ambulatory Visit | Attending: NURSE PRACTITIONER | Admitting: NURSE PRACTITIONER

## 2023-01-25 ENCOUNTER — Other Ambulatory Visit: Payer: Self-pay

## 2023-01-25 ENCOUNTER — Other Ambulatory Visit (INDEPENDENT_AMBULATORY_CARE_PROVIDER_SITE_OTHER): Payer: Self-pay | Admitting: NURSE PRACTITIONER

## 2023-01-25 DIAGNOSIS — M8580 Other specified disorders of bone density and structure, unspecified site: Secondary | ICD-10-CM | POA: Insufficient documentation

## 2023-01-25 DIAGNOSIS — Z7983 Long term (current) use of bisphosphonates: Secondary | ICD-10-CM | POA: Insufficient documentation

## 2023-01-25 DIAGNOSIS — M8588 Other specified disorders of bone density and structure, other site: Secondary | ICD-10-CM

## 2023-01-25 DIAGNOSIS — H9190 Unspecified hearing loss, unspecified ear: Secondary | ICD-10-CM

## 2023-01-25 DIAGNOSIS — R296 Repeated falls: Secondary | ICD-10-CM

## 2023-01-25 DIAGNOSIS — C50911 Malignant neoplasm of unspecified site of right female breast: Secondary | ICD-10-CM | POA: Insufficient documentation

## 2023-01-25 DIAGNOSIS — Z170 Estrogen receptor positive status [ER+]: Secondary | ICD-10-CM | POA: Insufficient documentation

## 2023-01-25 DIAGNOSIS — R42 Dizziness and giddiness: Secondary | ICD-10-CM | POA: Insufficient documentation

## 2023-01-25 DIAGNOSIS — Z13820 Encounter for screening for osteoporosis: Secondary | ICD-10-CM | POA: Insufficient documentation

## 2023-01-25 MED ORDER — GADOBUTROL 10 MMOL/10 ML (1 MMOL/ML) INTRAVENOUS SOLUTION
10.0000 mL | INTRAVENOUS | Status: AC
Start: 2023-01-25 — End: 2023-01-25
  Administered 2023-01-25: 6 mL via INTRAVENOUS

## 2023-05-03 ENCOUNTER — Other Ambulatory Visit (INDEPENDENT_AMBULATORY_CARE_PROVIDER_SITE_OTHER): Payer: Self-pay | Admitting: MEDICAL ONCOLOGY

## 2023-05-17 ENCOUNTER — Other Ambulatory Visit (HOSPITAL_COMMUNITY): Payer: Self-pay | Admitting: PHYSICIAN ASSISTANT

## 2023-05-17 DIAGNOSIS — N644 Mastodynia: Secondary | ICD-10-CM

## 2023-05-17 DIAGNOSIS — Z853 Personal history of malignant neoplasm of breast: Secondary | ICD-10-CM

## 2023-05-17 DIAGNOSIS — R590 Localized enlarged lymph nodes: Secondary | ICD-10-CM

## 2023-05-24 ENCOUNTER — Telehealth (INDEPENDENT_AMBULATORY_CARE_PROVIDER_SITE_OTHER): Payer: Self-pay | Admitting: NURSE PRACTITIONER

## 2023-05-24 ENCOUNTER — Encounter (HOSPITAL_COMMUNITY): Payer: Self-pay

## 2023-05-24 ENCOUNTER — Other Ambulatory Visit: Payer: Self-pay

## 2023-05-24 ENCOUNTER — Other Ambulatory Visit (HOSPITAL_COMMUNITY): Payer: Self-pay

## 2023-05-24 ENCOUNTER — Ambulatory Visit
Admission: RE | Admit: 2023-05-24 | Discharge: 2023-05-24 | Disposition: A | Discharge status: Home / Self Care | Payer: Medicare Other | Source: Ambulatory Visit | Attending: PHYSICIAN ASSISTANT | Admitting: PHYSICIAN ASSISTANT

## 2023-05-24 DIAGNOSIS — N644 Mastodynia: Secondary | ICD-10-CM | POA: Insufficient documentation

## 2023-05-24 DIAGNOSIS — Z853 Personal history of malignant neoplasm of breast: Secondary | ICD-10-CM | POA: Insufficient documentation

## 2023-05-24 DIAGNOSIS — R590 Localized enlarged lymph nodes: Secondary | ICD-10-CM | POA: Insufficient documentation

## 2023-05-24 DIAGNOSIS — N6320 Unspecified lump in the left breast, unspecified quadrant: Secondary | ICD-10-CM

## 2023-05-24 NOTE — Telephone Encounter (Signed)
Patient is advised not to take Ospemifene has not been adequately studied in patients with breast cancer. Use is not currently recommended in patients with carcinoma of the breast (known, suspected or history). Patients verbalizes understanding and states she has been on it for a while but will stop the medication.    Evie Lacks APRN, FNP-C 10/21/202416:47

## 2023-06-14 ENCOUNTER — Other Ambulatory Visit: Payer: Self-pay

## 2023-06-14 ENCOUNTER — Ambulatory Visit
Admission: RE | Admit: 2023-06-14 | Discharge: 2023-06-14 | Disposition: A | Discharge status: Home / Self Care | Payer: Medicare Other | Source: Ambulatory Visit | Attending: PHYSICIAN ASSISTANT | Admitting: PHYSICIAN ASSISTANT

## 2023-06-14 DIAGNOSIS — N644 Mastodynia: Secondary | ICD-10-CM | POA: Insufficient documentation

## 2023-06-14 DIAGNOSIS — N6320 Unspecified lump in the left breast, unspecified quadrant: Secondary | ICD-10-CM | POA: Insufficient documentation

## 2023-06-14 DIAGNOSIS — N6001 Solitary cyst of right breast: Secondary | ICD-10-CM

## 2023-07-07 ENCOUNTER — Ambulatory Visit: Payer: Medicare Other | Attending: NURSE PRACTITIONER | Admitting: NURSE PRACTITIONER

## 2023-07-07 ENCOUNTER — Other Ambulatory Visit: Payer: Self-pay

## 2023-07-07 ENCOUNTER — Ambulatory Visit (INDEPENDENT_AMBULATORY_CARE_PROVIDER_SITE_OTHER)
Admission: RE | Admit: 2023-07-07 | Discharge: 2023-07-07 | Disposition: A | Discharge status: Home / Self Care | Payer: Medicare Other | Source: Ambulatory Visit | Attending: NURSE PRACTITIONER | Admitting: NURSE PRACTITIONER

## 2023-07-07 ENCOUNTER — Encounter (INDEPENDENT_AMBULATORY_CARE_PROVIDER_SITE_OTHER): Payer: Self-pay | Admitting: NURSE PRACTITIONER

## 2023-07-07 VITALS — BP 128/64 | Temp 98.2°F | Ht 64.0 in | Wt 155.6 lb

## 2023-07-07 DIAGNOSIS — Z08 Encounter for follow-up examination after completed treatment for malignant neoplasm: Secondary | ICD-10-CM | POA: Insufficient documentation

## 2023-07-07 DIAGNOSIS — C50911 Malignant neoplasm of unspecified site of right female breast: Secondary | ICD-10-CM

## 2023-07-07 DIAGNOSIS — Z79811 Long term (current) use of aromatase inhibitors: Secondary | ICD-10-CM | POA: Insufficient documentation

## 2023-07-07 DIAGNOSIS — M8580 Other specified disorders of bone density and structure, unspecified site: Secondary | ICD-10-CM | POA: Insufficient documentation

## 2023-07-07 DIAGNOSIS — Z853 Personal history of malignant neoplasm of breast: Secondary | ICD-10-CM | POA: Insufficient documentation

## 2023-07-07 DIAGNOSIS — Z9011 Acquired absence of right breast and nipple: Secondary | ICD-10-CM | POA: Insufficient documentation

## 2023-07-07 DIAGNOSIS — H9190 Unspecified hearing loss, unspecified ear: Secondary | ICD-10-CM | POA: Insufficient documentation

## 2023-07-07 DIAGNOSIS — Z170 Estrogen receptor positive status [ER+]: Secondary | ICD-10-CM

## 2023-07-07 DIAGNOSIS — Z923 Personal history of irradiation: Secondary | ICD-10-CM | POA: Insufficient documentation

## 2023-07-07 DIAGNOSIS — Z9181 History of falling: Secondary | ICD-10-CM | POA: Insufficient documentation

## 2023-07-07 DIAGNOSIS — Z79899 Other long term (current) drug therapy: Secondary | ICD-10-CM | POA: Insufficient documentation

## 2023-07-07 DIAGNOSIS — Z9884 Bariatric surgery status: Secondary | ICD-10-CM | POA: Insufficient documentation

## 2023-07-07 DIAGNOSIS — M79604 Pain in right leg: Secondary | ICD-10-CM | POA: Insufficient documentation

## 2023-07-07 DIAGNOSIS — R9089 Other abnormal findings on diagnostic imaging of central nervous system: Secondary | ICD-10-CM | POA: Insufficient documentation

## 2023-07-07 LAB — CBC WITH DIFF
BASOPHIL #: 0.3 10*3/uL — ABNORMAL HIGH (ref 0.00–0.10)
BASOPHIL %: 3 % — ABNORMAL HIGH (ref 0–1)
EOSINOPHIL #: 0.8 10*3/uL — ABNORMAL HIGH (ref 0.00–0.50)
EOSINOPHIL %: 8 % — ABNORMAL HIGH (ref 1–7)
HCT: 38.5 % (ref 31.2–41.9)
HGB: 12.9 g/dL (ref 10.9–14.3)
LYMPHOCYTE #: 3.1 10*3/uL — ABNORMAL HIGH (ref 1.00–3.00)
LYMPHOCYTE %: 34 % (ref 16–44)
MCH: 29.4 pg (ref 24.7–32.8)
MCHC: 33.4 g/dL (ref 32.3–35.6)
MCV: 88.1 fL (ref 75.5–95.3)
MONOCYTE #: 0.6 10*3/uL (ref 0.30–1.00)
MONOCYTE %: 6 % (ref 5–13)
MPV: 8.9 fL (ref 7.9–10.8)
NEUTROPHIL #: 4.5 10*3/uL (ref 1.85–7.80)
NEUTROPHIL %: 49 % (ref 43–77)
PLATELETS: 256 10*3/uL (ref 140–440)
RBC: 4.38 10*6/uL (ref 3.63–4.92)
RDW: 14.7 % (ref 12.3–17.7)
WBC: 9.2 10*3/uL (ref 3.8–11.8)

## 2023-07-07 LAB — COMPREHENSIVE METABOLIC PANEL, NON-FASTING
ALBUMIN/GLOBULIN RATIO: 1.2 (ref 0.8–1.4)
ALBUMIN: 4.1 g/dL (ref 3.5–5.7)
ALKALINE PHOSPHATASE: 77 U/L (ref 34–104)
ALT (SGPT): 15 U/L (ref 7–52)
ANION GAP: 8 mmol/L (ref 4–13)
AST (SGOT): 17 U/L (ref 13–39)
BILIRUBIN TOTAL: 0.4 mg/dL (ref 0.3–1.0)
BUN/CREA RATIO: 24 — ABNORMAL HIGH (ref 6–22)
BUN: 20 mg/dL (ref 7–25)
CALCIUM, CORRECTED: 9.6 mg/dL (ref 8.9–10.8)
CALCIUM: 9.7 mg/dL (ref 8.6–10.3)
CHLORIDE: 105 mmol/L (ref 98–107)
CO2 TOTAL: 26 mmol/L (ref 21–31)
CREATININE: 0.82 mg/dL (ref 0.60–1.30)
ESTIMATED GFR: 80 mL/min/{1.73_m2} (ref >59)
GLOBULIN: 3.4 (ref 2.0–3.5)
GLUCOSE: 137 mg/dL — ABNORMAL HIGH (ref 74–109)
OSMOLALITY, CALCULATED: 282 mosm/kg (ref 270–290)
POTASSIUM: 4 mmol/L (ref 3.5–5.1)
PROTEIN TOTAL: 7.5 g/dL (ref 6.4–8.9)
SODIUM: 139 mmol/L (ref 136–145)

## 2023-07-07 MED ORDER — LETROZOLE 2.5 MG TABLET
2.5000 mg | ORAL_TABLET | Freq: Every day | ORAL | 1 refills | Status: DC
Start: 2023-07-07 — End: 2023-11-30

## 2023-07-07 NOTE — Cancer Center Note (Signed)
Department of Hematology/Oncology  Return Patient Visit       Jasmine Bates  [id number]  [birth year]   12/01/2021    DIAGNOSIS: Breast cancer.    HISTORY OF PRESENT ILLNESS:  Jasmine Bates is a 67 y.o. female who presents to today  for evaluation of Breast cancer. Patient was switched to Anastrazole on 06/04/21 due to complaints of excessive hot flashes but did not decrease the hot flashes. She requested to resume Letrozole on 12/01/21.  Denies fever, chills, diaphoresis, night sweats, and malaise. No infections during the interim. She was  under the care of [facility], with an epidural January of 2024 . Ongoing falls with feeling like her legs can't support her weight, with dizziness upon rising. She has been seen by hearing and balance clinic and was told her "crystals were out of alignment". She is scheduled with Neurology on 11/02/2023 at 10 am due to abnormal brain MRI.     REVIEW OF SYSTEMS:  Review of Systems   Constitutional:  Negative for appetite change, chills, fatigue and fever.   Eyes:  Positive for eye problems (burn and itch).   Respiratory:  Negative for cough and shortness of breath.    Cardiovascular:  Negative for chest pain, leg swelling and palpitations.   Gastrointestinal:  Negative for abdominal pain, blood in stool, diarrhea and nausea.   Endocrine: Positive for hot flashes (improving from previous report).   Genitourinary:  Negative for difficulty urinating and dyspareunia.    Musculoskeletal:  Positive for back pain.   Skin: Negative.    Neurological:  Positive for dizziness (feeling she is spinning when getting up quickly) and extremity weakness.   Hematological:  Negative for adenopathy. Does not bruise/bleed easily.   Psychiatric/Behavioral:  Positive for sleep disturbance (trouble falling asleep at night). Negative for depression. The patient is not nervous/anxious.          Past Medical History:   Diagnosis Date    BRCA1 gene mutation negative     BRCA2 gene mutation negative      Breast CA (CMS HCC) 2020    right lumpectomy/radiation/arimadex    COPD (chronic obstructive pulmonary disease) (CMS HCC)     Diabetes mellitus, type 2 (CMS HCC)     Essential hypertension     Fatty liver     Hyperlipidemia     Hypothyroidism     Malignant neoplasm of breast (CMS HCC) 12/01/2021    Osteoarthritis     Piriformis syndrome     Sacroiliitis (CMS HCC)      Past Surgical History:   Procedure Laterality Date    CESAREAN SECTION      HX BREAST LUMPECTOMY Right     10/2018 positive  pt had radiation    HX GASTRIC SLEEVE       Social History     Socioeconomic History    Marital status: Married     Spouse name: Not on file    Number of children: Not on file    Years of education: Not on file    Highest education level: Not on file   Occupational History    Not on file   Tobacco Use    Smoking status: Never    Smokeless tobacco: Never   Vaping Use    Vaping status: Never Used   Substance and Sexual Activity    Alcohol use: Never    Drug use: Never    Sexual activity: Not on file   Other  Topics Concern    Not on file   Social History Narrative    Not on file     Social Determinants of Health     Financial Resource Strain: Not on file   Transportation Needs: Not on file   Social Connections: Not on file   Intimate Partner Violence: Not on file   Housing Stability: Not on file     Social History     Social History Narrative    Not on file     Social History     Substance and Sexual Activity   Drug Use Never     Family Medical History:       Problem Relation (Age of Onset)    Breast Cancer Other    Hypertension (High Blood Pressure) Father    Lung Cancer Mother    Melanoma Mother    No Known Problems Sister, Brother, Maternal Grandmother, Maternal Grandfather, Paternal Grandmother, Paternal Grandfather, Daughter, Son, Maternal Aunt, Maternal Uncle, Paternal Aunt, Paternal Uncle          Current Outpatient Medications   Medication Sig    albuterol sulfate (PROVENTIL OR VENTOLIN OR PROAIR) 90 mcg/actuation  Inhalation oral inhaler Take 1-2 Puffs by inhalation    alendronate (FOSAMAX) 70 mg Oral Tablet TAKE 1 TABLET (70 MG TOTAL) BY MOUTH EVERY 7 DAYS    atorvastatin (LIPITOR) 80 mg Oral Tablet Take 1 Tablet (80 mg total) by mouth Once a day    BD UF NANO PEN NEEDLE 32 gauge x 5/32" Needle USE AS DIRECTED WITH INSULIN PEN    biotin 1 mg Oral Capsule Take by mouth    bisacodyL (DULCOLAX) 5 mg Oral Tablet, Delayed Release (E.C.) Take 1 Tablet (5 mg total) by mouth    cetirizine (ZYRTEC) 10 mg Oral Tablet Take 1 Tablet (10 mg total) by mouth Once a day    Cholecalciferol, Vitamin D3, 50 mcg (2,000 unit) Oral Capsule Take 1 Capsule by mouth    cranberry fruit extract (CRANBERRY POWDER ORAL) Take 1 Tablet by mouth    ergocalciferol, vitamin D2, (DRISDOL) 1,250 mcg (50,000 unit) Oral Capsule     FREESTYLE LIBRE 3 SENSOR Does not apply Device APPLY EVERY 14 DAYS    glimepiride (AMARYL) 2 mg Oral Tablet Take 1 Tablet (2 mg total) by mouth Twice daily    letrozole (FEMARA) 2.5 mg Oral Tablet TAKE 1 TABLET (2.5 MG TOTAL) BY MOUTH ONCE A DAY FOR 180 DAYS    levothyroxine (SYNTHROID) 100 mcg Oral Tablet     lisinopriL (PRINIVIL) 20 mg Oral Tablet     metFORMIN (GLUCOPHAGE XR) 500 mg Oral Tablet Sustained Release 24 hr TAKE 2 TABLETS BY MOUTH 2 TIMES DAILY (WITH MEALS)    montelukast (SINGULAIR) 10 mg Oral Tablet     NOVOLIN N FLEXPEN 100 unit/mL (3 mL) Subcutaneous Insulin Pen subcutaneous pen     ondansetron (ZOFRAN ODT) 4 mg Oral Tablet, Rapid Dissolve     OSPHENA 60 mg Oral Tablet Take 1 Tablet (60 mg total) by mouth Once a day    oxyBUTYnin (DITROPAN XL) 5 mg Oral Tablet Extended Rel 24 hr Take 1 Tablet (5 mg total) by mouth Once a day    OZEMPIC 2 mg/dose (8 mg/3 mL) Subcutaneous Pen Injector Inject 2 mg under the skin Every 7 days    pantoprazole (PROTONIX) 40 mg Oral Tablet, Delayed Release (E.C.)     pioglitazone (ACTOS) 30 mg Oral Tablet     sertraline (ZOLOFT) 100 mg  Oral Tablet Take 1 Tablet (100 mg total) by mouth Once  a day    sertraline (ZOLOFT) 50 mg Oral Tablet     SUMAtriptan (IMITREX) 50 mg Oral Tablet     verapamiL (VERELAN PM) 300 mg Oral Capsule, 24hr ER Pellet CT      Allergies   Allergen Reactions    Nsaids (Non-Steroidal Anti-Inflammatory Drug) Anaphylaxis    Sulfamethoxazole-Trimethoprim Angioedema and Rash    Aspirin     Sulfa (Sulfonamides)      VITAL SIGNS:  BP 128/64 (Site: Right Arm, Patient Position: Sitting, Cuff Size: Adult)   Temp 36.8 C (98.2 F) (Temporal)   Ht 1.626 m (5\' 4" )   Wt 70.6 kg (155 lb 9.6 oz)   SpO2 98%   BMI 26.71 kg/m   ECOG Status: (0) Fully active, able to carry on all predisease performance without restriction.    Physical Exam  Vitals and nursing note reviewed.   Constitutional:       Appearance: Normal appearance.   HENT:      Head: Normocephalic and atraumatic.      Right Ear: Tympanic membrane, ear canal and external ear normal. There is no impacted cerumen.      Left Ear: Tympanic membrane, ear canal and external ear normal. There is no impacted cerumen.      Nose: Nose normal.      Mouth/Throat:      Mouth: Mucous membranes are moist.      Tongue: Tongue does not deviate from midline.   Eyes:      General: No scleral icterus.        Right eye: No discharge.         Left eye: No discharge.      Extraocular Movements: Extraocular movements intact.      Conjunctiva/sclera: Conjunctivae normal.      Pupils: Pupils are equal, round, and reactive to light.   Cardiovascular:      Rate and Rhythm: Normal rate and regular rhythm.      Heart sounds: Normal heart sounds, S1 normal and S2 normal. No murmur heard.     No friction rub.   Pulmonary:      Effort: Pulmonary effort is normal.      Breath sounds: Normal breath sounds. No wheezing or rhonchi.   Chest:   Breasts:     Right: Normal.      Left: Normal.      Comments: Declines exam today   Abdominal:      General: Bowel sounds are normal.      Palpations: Abdomen is soft.      Tenderness: There is no abdominal tenderness.    Musculoskeletal:         General: No tenderness or deformity. Normal range of motion.      Cervical back: Normal range of motion and neck supple. No bony tenderness.      Thoracic back: No bony tenderness.      Lumbar back: No bony tenderness.      Right lower leg: No edema.      Left lower leg: No edema.   Lymphadenopathy:      Cervical: No cervical adenopathy.      Upper Body:      Right upper body: No supraclavicular or axillary adenopathy.      Left upper body: No supraclavicular or axillary adenopathy.   Skin:     General: Skin is warm and dry.      Capillary  Refill: Capillary refill takes 2 to 3 seconds.      Findings: No bruising or erythema.   Neurological:      General: No focal deficit present.      Mental Status: She is alert and oriented to person, place, and time.      Sensory: Sensation is intact.      Motor: Motor function is intact. No weakness.      Coordination: Coordination is intact. Coordination normal.      Gait: Gait is intact. Gait normal.      Comments: Negative for Nystagmus    Psychiatric:         Attention and Perception: Attention and perception normal.         Mood and Affect: Mood and affect normal.         Speech: Speech normal.         Behavior: Behavior normal. Behavior is cooperative.         Thought Content: Thought content normal.         Cognition and Memory: Cognition and memory normal.         Judgment: Judgment normal.      DIAGNOSTIC DATA:    LABS  CBC  Diff   Lab Results   Component Value Date/Time    WBC 9.2 07/07/2023 02:39 PM    HGB 12.9 07/07/2023 02:39 PM    HCT 38.5 07/07/2023 02:39 PM    PLTCNT 256 07/07/2023 02:39 PM    RBC 4.38 07/07/2023 02:39 PM    MCV 88.1 07/07/2023 02:39 PM    MCHC 33.4 07/07/2023 02:39 PM    MCH 29.4 07/07/2023 02:39 PM    RDW 14.7 07/07/2023 02:39 PM    MPV 8.9 07/07/2023 02:39 PM    Lab Results   Component Value Date/Time    PMNS 49 07/07/2023 02:39 PM    LYMPHOCYTES 34 07/07/2023 02:39 PM    EOSINOPHIL 8 (H) 07/07/2023 02:39 PM     MONOCYTES 6 07/07/2023 02:39 PM    BASOPHILS 3 (H) 07/07/2023 02:39 PM    BASOPHILS 0.30 (H) 07/07/2023 02:39 PM    PMNABS 4.50 07/07/2023 02:39 PM    LYMPHSABS 3.10 (H) 07/07/2023 02:39 PM    EOSABS 0.80 (H) 07/07/2023 02:39 PM    MONOSABS 0.60 07/07/2023 02:39 PM            Comprehensive Metabolic Profile    Lab Results   Component Value Date    SODIUM 139 07/07/2023    POTASSIUM 4.0 07/07/2023    CHLORIDE 105 07/07/2023    CO2 26 07/07/2023    ANIONGAP 8 07/07/2023    BUN 20 07/07/2023    CREATININE 0.82 07/07/2023    ALBUMIN 4.1 07/07/2023    CALCIUM 9.7 07/07/2023    GLUCOSENF 137 (H) 07/07/2023    ALKPHOS 77 07/07/2023    ALT 15 07/07/2023    AST 17 07/07/2023    TOTBILIRUBIN 0.4 07/07/2023    TOTALPROTEIN 7.5 07/07/2023         ESTIMATED GFR   Date Value Ref Range Status   07/07/2023 80 >59 mL/min/1.40m^2 Final     CA 27.29   Date Value Ref Range Status   01/05/2023 18 <38 U/mL Final     Comment:        This test was performed using the Siemens chemilumi-  nescent method. Values obtained from different assay  methods cannot be used interchangeably. CA27.29  levels, regardless of value, should not be  interpreted  as absolute evidence of the presence or absence of  disease.              Labs from 09-12-20: WBC 5.5, Hgb 14.1, Hct 42.8, plt 232 K, LFTs within normal limits, CA 27.29:  15.1    Pathology:  None to Review today    Radiology:    01/25/2023 MRI of Brain  There are numerous periventricular and subcortical white matter T2 and FLAIR hyperintense foci. This is nonspecific the main considerations are advanced small vessel ischemic change or a demyelinating process.   Diffusion weighted images show no evidence of acute or recent infarct.   Postcontrast images show no suspicious enhancement.   Ventricles: Normal.   Major Intracranial Vessels: Normal flow voids.   Sinuses: There is extensive mucosal thickening throughout the sinuses.  Mastoids: Clear.         05/24/2023  Mammogram Bilateral    Left  Lymph  Node: There is a benign reactive appearing lymph node again seen in the left axilla. An overlying palpable marker is seen in this region. Further evaluation with ultrasound is recommended.      Right  Post-Surgical Finding: There are post-surgical findings from a previous lumpectomy seen in the upper inner quadrant of the right breast. Compared to the previous study, there are no significant changes.   Post-Surgical Finding: There are post-surgical scar marker again seen in the right axilla.      No suspicious mammographic abnormality is seen to correspond to the patient 's area of concern right breast. Further evaluation with ultrasound is recommended.      IMPRESSION:  Ultrasound is recommended for both breasts.    06/14/2023 [place] of Breast-   NO SUSPICIOUS MAMMOGRAPHIC OR SONOGRAPHIC ABNORMALITY ABOUT THE RIGHT BREAST IS IDENTIFIED. POSTSURGICAL CHANGES ARE NOTED. CLOSE CLINICAL FOLLOW-UP IS RECOMMENDED.      NO SUSPICIOUS SONOGRAPHIC OR MAMMOGRAPHIC ABNORMALITY ABOUT THE LEFT AXILLARY REGION IS SEEN TO CORRESPOND TO THE PATIENT'S PALPABLE ABNORMALITY. BENIGN-APPEARING LEFT AXILLARY LYMPH NODE IS SEEN ON MAMMOGRAM FROM 05/24/2023. CLOSE CLINICAL FOLLOW-UP IS RECOMMENDED.     APPROXIMATELY 0.8 CM BENIGN-APPEARING CYST 1:00 POSITION RIGHT BREAST JUST BENEATH THE SKIN APPROXIMATELY 7 CM FROM THE NIPPLE.     PLEASE NOTE BIOPSY SHOULD NOT BE DELAYED IN CASES OF SUSPICIOUS PALPABLE ABNORMALITIES BASED ON IMAGING FINDINGS.    01/25/2023 Bone Density- Normal bone mineral density was measured on the current examination.       ASSESSMENT:  1. 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  A 12/22/2018 right breast core needle biopsy showed a benign sclerosing lesion only .    Osteopenia- A 12/22/2021 DEXA scan showed osteopenia.  Note that patient is also on SERM, Ospemifene 60 mg PO daily. Will continue with Ca + Vit D and add Alendronate weekly.     2. 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 Right leg pain probably sciatica- previously under care of [facility]      4.  History of obesity status post gastric bypass surgery        PLAN:  All relative external and internal medical records were reviewed including available H&Ps, progress notes, procedure notes, imaging's, laboratories, and pathology       1. Continue with Letrozole 2.5 mg PO daily till 03/2024 RX provided today   2. Due for next Dexa Scan after 01/24/25   3. Repeat Mammogram due 11/2023   4. MRI of Brain showed some abnormalities and she was referred to Neurology apt for 11/02/23  5. [facility] [employer] [facility] clinic for declining hearing with frequent falls we will need this report   6. Continue Alendronate 70 mg PO weekly (further refills, per PCP).  7. RTC 6 months  with repeat labs as above CBC, CMP CA 27-29    The patient was given the opportunity to ask questions, and these were answered to their satisfaction. They are welcome to call with any questions or concerns at any point.      On the day of the encounter, I spent a total of  33  minutes on this patient encounter including review of historical information, examination, documentation and post-visit activities.       Evie Lacks APRN, FNP-C 12/4/202415:11      CC:  Mariah Milling, DO  [address]  [place] [address]

## 2023-07-12 LAB — CA 27,29: CA 27.29: 14 U/mL (ref <38)

## 2023-07-13 ENCOUNTER — Encounter (INDEPENDENT_AMBULATORY_CARE_PROVIDER_SITE_OTHER): Payer: Self-pay | Admitting: NURSE PRACTITIONER

## 2023-08-09 ENCOUNTER — Other Ambulatory Visit: Payer: Self-pay

## 2023-08-09 ENCOUNTER — Encounter (HOSPITAL_COMMUNITY): Payer: Self-pay | Admitting: Family Medicine

## 2023-08-09 ENCOUNTER — Emergency Department
Admission: EM | Admit: 2023-08-09 | Discharge: 2023-08-09 | Disposition: A | Discharge status: Home / Self Care | Payer: Medicare Other | Attending: Family Medicine | Admitting: Family Medicine

## 2023-08-09 ENCOUNTER — Emergency Department (HOSPITAL_COMMUNITY): Payer: Medicare Other

## 2023-08-09 DIAGNOSIS — Z7985 Long-term (current) use of injectable non-insulin antidiabetic drugs: Secondary | ICD-10-CM | POA: Insufficient documentation

## 2023-08-09 DIAGNOSIS — R0789 Other chest pain: Secondary | ICD-10-CM | POA: Insufficient documentation

## 2023-08-09 DIAGNOSIS — E1165 Type 2 diabetes mellitus with hyperglycemia: Secondary | ICD-10-CM | POA: Insufficient documentation

## 2023-08-09 DIAGNOSIS — I10 Essential (primary) hypertension: Secondary | ICD-10-CM | POA: Insufficient documentation

## 2023-08-09 DIAGNOSIS — Z794 Long term (current) use of insulin: Secondary | ICD-10-CM | POA: Insufficient documentation

## 2023-08-09 DIAGNOSIS — K219 Gastro-esophageal reflux disease without esophagitis: Secondary | ICD-10-CM | POA: Insufficient documentation

## 2023-08-09 DIAGNOSIS — Z1152 Encounter for screening for COVID-19: Secondary | ICD-10-CM | POA: Insufficient documentation

## 2023-08-09 DIAGNOSIS — R9431 Abnormal electrocardiogram [ECG] [EKG]: Secondary | ICD-10-CM | POA: Insufficient documentation

## 2023-08-09 DIAGNOSIS — Z7984 Long term (current) use of oral hypoglycemic drugs: Secondary | ICD-10-CM

## 2023-08-09 LAB — LIPASE: LIPASE: 53 U/L (ref 11–82)

## 2023-08-09 LAB — CBC WITH DIFF
BASOPHIL #: 0.2 10*3/uL — ABNORMAL HIGH (ref 0.00–0.10)
BASOPHIL %: 2 % — ABNORMAL HIGH (ref 0–1)
EOSINOPHIL #: 0.7 10*3/uL — ABNORMAL HIGH (ref 0.00–0.50)
EOSINOPHIL %: 7 % (ref 1–7)
HCT: 40 % (ref 31.2–41.9)
HGB: 13.2 g/dL (ref 10.9–14.3)
LYMPHOCYTE #: 2.6 10*3/uL (ref 1.00–3.00)
LYMPHOCYTE %: 30 % (ref 16–44)
MCH: 28.4 pg (ref 24.7–32.8)
MCHC: 33 g/dL (ref 32.3–35.6)
MCV: 86.2 fL (ref 75.5–95.3)
MONOCYTE #: 0.6 10*3/uL (ref 0.30–1.00)
MONOCYTE %: 7 % (ref 5–13)
MPV: 9.2 fL (ref 7.9–10.8)
NEUTROPHIL #: 4.8 10*3/uL (ref 1.85–7.80)
NEUTROPHIL %: 54 % (ref 43–77)
PLATELETS: 258 10*3/uL (ref 140–440)
RBC: 4.64 10*6/uL (ref 3.63–4.92)
RDW: 14 % (ref 12.3–17.7)
WBC: 8.9 10*3/uL (ref 3.8–11.8)

## 2023-08-09 LAB — COMPREHENSIVE METABOLIC PANEL, NON-FASTING
ALBUMIN/GLOBULIN RATIO: 1.2 (ref 0.8–1.4)
ALBUMIN: 4 g/dL (ref 3.5–5.7)
ALKALINE PHOSPHATASE: 80 U/L (ref 34–104)
ALT (SGPT): 14 U/L (ref 7–52)
ANION GAP: 8 mmol/L (ref 4–13)
AST (SGOT): 14 U/L (ref 13–39)
BILIRUBIN TOTAL: 0.4 mg/dL (ref 0.3–1.0)
BUN/CREA RATIO: 23 — ABNORMAL HIGH (ref 6–22)
BUN: 18 mg/dL (ref 7–25)
CALCIUM, CORRECTED: 9.5 mg/dL (ref 8.9–10.8)
CALCIUM: 9.5 mg/dL (ref 8.6–10.3)
CHLORIDE: 106 mmol/L (ref 98–107)
CO2 TOTAL: 24 mmol/L (ref 21–31)
CREATININE: 0.78 mg/dL (ref 0.60–1.30)
ESTIMATED GFR: 84 mL/min/{1.73_m2} (ref >59)
GLOBULIN: 3.3 (ref 2.0–3.5)
GLUCOSE: 222 mg/dL — ABNORMAL HIGH (ref 74–109)
OSMOLALITY, CALCULATED: 285 mosm/kg (ref 270–290)
POTASSIUM: 4.6 mmol/L (ref 3.5–5.1)
PROTEIN TOTAL: 7.3 g/dL (ref 6.4–8.9)
SODIUM: 138 mmol/L (ref 136–145)

## 2023-08-09 LAB — TROPONIN-I
TROPONIN I: 3 ng/L (ref <15)
TROPONIN I: 2 ng/L (ref <15)

## 2023-08-09 LAB — PT/INR
INR: 0.98 (ref 0.84–1.10)
PROTHROMBIN TIME: 11.1 s (ref 9.8–12.7)

## 2023-08-09 LAB — PTT (PARTIAL THROMBOPLASTIN TIME): APTT: 31.9 s (ref 25.0–38.0)

## 2023-08-09 LAB — GOLD TOP TUBE

## 2023-08-09 LAB — COVID-19, FLU A/B, RSV RAPID BY PCR
INFLUENZA VIRUS TYPE A: NOT DETECTED
INFLUENZA VIRUS TYPE B: NOT DETECTED
RESPIRATORY SYNCTIAL VIRUS (RSV): NOT DETECTED
SARS-CoV-2: NOT DETECTED

## 2023-08-09 LAB — GRAY TOP TUBE

## 2023-08-09 MED ORDER — METHOCARBAMOL 500 MG TABLET
ORAL_TABLET | ORAL | Status: AC
Start: 2023-08-09 — End: 2023-08-09
  Filled 2023-08-09: qty 2

## 2023-08-09 MED ORDER — ALUMINUM-MAG HYDROXIDE-SIMETHICONE 200 MG-200 MG-20 MG/5 ML ORAL SUSP
30.0000 mL | Freq: Once | ORAL | Status: AC
Start: 2023-08-09 — End: 2023-08-09
  Administered 2023-08-09: 30 mL via ORAL

## 2023-08-09 MED ORDER — ALUMINUM-MAG HYDROXIDE-SIMETHICONE 200 MG-200 MG-20 MG/5 ML ORAL SUSP
ORAL | Status: AC
Start: 2023-08-09 — End: 2023-08-09
  Filled 2023-08-09: qty 30

## 2023-08-09 MED ORDER — METHOCARBAMOL 500 MG TABLET
1000.0000 mg | ORAL_TABLET | ORAL | Status: AC
Start: 2023-08-09 — End: 2023-08-09
  Administered 2023-08-09: 1000 mg via ORAL

## 2023-08-09 MED ORDER — LIDOCAINE HCL 2 % MUCOSAL SOLUTION
Status: AC
Start: 2023-08-09 — End: 2023-08-09
  Filled 2023-08-09: qty 15

## 2023-08-09 MED ORDER — LIDOCAINE HCL 2 % MUCOSAL SOLUTION
15.0000 mL | Freq: Once | Status: AC
Start: 2023-08-09 — End: 2023-08-09
  Administered 2023-08-09: 15 mL via ORAL

## 2023-08-09 MED ORDER — ACETAMINOPHEN 325 MG TABLET
ORAL_TABLET | ORAL | Status: AC
Start: 2023-08-09 — End: 2023-08-09
  Filled 2023-08-09: qty 2

## 2023-08-09 MED ORDER — HYOSCYAMINE 0.125 MG/5 ML ORAL ELIXIR
ORAL_SOLUTION | ORAL | Status: AC
Start: 2023-08-09 — End: 2023-08-09
  Filled 2023-08-09: qty 5

## 2023-08-09 MED ORDER — HYOSCYAMINE 0.125 MG/5 ML ORAL ELIXIR
10.0000 mL | ORAL_SOLUTION | Freq: Once | ORAL | Status: AC
Start: 2023-08-09 — End: 2023-08-09
  Administered 2023-08-09: 10 mL via ORAL

## 2023-08-09 MED ORDER — METHOCARBAMOL 500 MG TABLET
1000.0000 mg | ORAL_TABLET | Freq: Four times a day (QID) | ORAL | 0 refills | Status: AC | PRN
Start: 2023-08-09 — End: ?

## 2023-08-09 MED ORDER — ACETAMINOPHEN 325 MG TABLET
650.0000 mg | ORAL_TABLET | ORAL | Status: AC
Start: 2023-08-09 — End: 2023-08-09
  Administered 2023-08-09: 650 mg via ORAL

## 2023-08-09 NOTE — ED Triage Notes (Signed)
Mid chest pain for 4-5 days. Denies SOB/N/V/D. Denies coughing but reports body aches.

## 2023-08-09 NOTE — ED APP Handoff Note (Signed)
[facility] - Emergency Department  Emergency Department  Provider in Triage Note    Name: NASTASSIA TEIGLAND  Age: 68 y.o.  Gender: female     Subjective:   MAKAILEE STANAWAY is a 68 y.o. female who presents with complaint of Chest Pain   .  Patient presents with sternal chest pain that began 4-5 days ago.  It has been intermittent. Pain is aching in nature 3/10.  Denies shortness a breath nausea vomiting.  She has severe allergy to ASA.     Objective:   Filed Vitals:    08/09/23 1215   BP: 134/68   Pulse: 85   Resp: 20   Temp: 36.6 C (97.8 F)   SpO2: 98%      Focused Physical Exam shows adult female alert upright in NAD    Assessment:  A medical screening exam was completed.  This patient is a 68 y.o. female with initial findings showing chest pain    Plan:  Please see initial orders and work-up below.  This is to be continued with full evaluation in the main Emergency Department.     No current facility-administered medications for this encounter.     Results for orders placed or performed during the hospital encounter of 08/09/23 (from the past 24 hour(s))   CBC/DIFF    Narrative    The following orders were created for panel order CBC/DIFF.  Procedure                               Abnormality         Status                     ---------                               -----------         ------                     CBC WITH ZHYQ[[id number]]                                                                 Please view results for these tests on the individual orders.        Senaida Lange, PA-C  08/09/2023, 12:16

## 2023-08-09 NOTE — ED Provider Notes (Signed)
[facility]  ED Primary Provider Note  Patient Name: Jasmine Bates  Patient Age: 68 y.o.  Date of Birth: [birth year]    Chief Complaint: Chest Pain         History of Present Illness       Jasmine Bates is a 68 y.o. female who had concerns including Chest Pain .  PATIENT PRESENTED TO THE EMERGENCY DEPARTMENT WITH COMPLAINTS OF CHEST PAIN FOR THE LAST 4-5 DAYS.  PATIENT COMPLAINS OF A PRESSURE IN THE CENTER OF HER CHEST WITH SOME ASSOCIATED TENDERNESS TO PALPATION.  SYMPTOMS WORSENED WITH DEEP INSPIRATION AND ALSO WHEN EATING.  PATIENT STATES THAT SHE THOUGHT IT MAYBE DUE TO INDIGESTION, BUT NOTHING REALLY SEEMS TO MAKE IT BETTER.  SHE DENIES ANY ASSOCIATED SHORTNESS OF BREATH OR COUGH.  SHE DENIES ANY ASSOCIATED FEVER OR CHILLS, ABDOMINAL PAIN, NAUSEA, VOMITING, DIARRHEA.  SHE STATES THAT SHE IS CURRENTLY BEING TREATED FOR URINARY TRACT INFECTION AND THAT SYMPTOMS ARE IMPROVING.  PATIENT DENIES ANY PREVIOUS CARDIAC ABNORMALITIES.  AT TIME OF EXAMINATION, PATIENT WAS RESTING COMFORTABLY AND IN NO APPARENT DISTRESS.  SHE DENIES ANY FURTHER COMPLAINTS.        Review of Systems     No other overt Review of Systems are noted to be positive except noted in the HPI.      Historical Data   History Reviewed This Encounter: Medical History  Surgical History  Family History  Social History      Physical Exam   ED Triage Vitals [08/09/23 1215]   BP (Non-Invasive) 134/68   Heart Rate 85   Respiratory Rate 20   Temperature 36.6 C (97.8 F)   SpO2 98 %   Weight 68 kg (150 lb)   Height 1.626 m (5\' 4" )         Nursing notes reviewed for what could be assessed. Past Medical, Surgical, and Social history reviewed for what has been completed.     Constitutional: NAD. Well-Developed. Well Nourished.  AFEBRILE.  Head: Normocephalic, atraumatic.  Mouth/Throat: no nasal discharge, posterior pharynx WNL  Eyes: EOM grossly intact, conjunctiva normal.  Cardiovascular: Regular Rate and Rhythm, extremities  well perfused.  Pulmonary/Chest: No respiratory distress. Lungs are symmetric to auscultation bilaterally.  TENDERNESS TO PALPATION OVERLYING THE STERNUM WITHOUT ANY OBVIOUS DEFORMITY APPRECIATED.  Abdominal: Soft, non-tender, non-distended. Non peritoneal, no rebound, no guarding.  MSK: No Lower Extremity Edema.  Skin: Warm, dry, and intact  Neuro: Appropriate, CN II-XII grossly intact   Psych: Cooperative           Procedures      Patient Data     Labs Ordered/Reviewed   COMPREHENSIVE METABOLIC PANEL, NON-FASTING - Abnormal; Notable for the following components:       Result Value    BUN/CREA RATIO 23 (*)     GLUCOSE 222 (*)     All other components within normal limits    Narrative:     Estimated Glomerular Filtration Rate (eGFR) is calculated using the CKD-EPI (2021) equation, intended for patients 1 years of age and older. If gender is not documented or "unknown", there will be no eGFR calculation.     CBC WITH DIFF - Abnormal; Notable for the following components:    BASOPHIL % 2 (*)     EOSINOPHIL # 0.70 (*)     BASOPHIL # 0.20 (*)     All other components within normal limits   PT/INR - Normal    Narrative:  In the setting of warfarin therapy, a moderate-intensity INR goal range is 2.0 to 3.0 and a high-intensity INR goal range is 2.5 to 3.5.    INR is ONLY validated to determine the level of anticoagulation with vitamin K antagonists (warfarin). Other factors may elevate the INR including but not limited to direct oral anticoagulants (DOACs), liver dysfunction, vitamin K deficiency, DIC, factor deficiencies, and factor inhibitors.   PTT (PARTIAL THROMBOPLASTIN TIME) - Normal   TROPONIN-I - Normal   TROPONIN-I - Normal   LIPASE - Normal   CBC/DIFF    Narrative:     The following orders were created for panel order CBC/DIFF.  Procedure                               Abnormality         Status                     ---------                               -----------         ------                     CBC WITH  [id number]]                Abnormal            Final result                 Please view results for these tests on the individual orders.   GOLD TOP TUBE   COVID-19, FLU A/B, RSV RAPID BY PCR       XR AP MOBILE CHEST   Final Result by Edi, Radresults In (01/06 1243)   NO ACUTE FINDINGS.            Radiologist location ID: [id number]             Medical Decision Making          Medical Decision Making        Studies Assessed:  LAB WORK, IMAGING, EKG    EKG:   This EKG interpreted by me shows:    Rate: 72    Interpretation:  NORMAL AXIS, SINUS RHYTHM, RATE 72, NONSPECIFIC ST-T WAVE CHANGES      MDM Narrative:  PATIENT PRESENTED TO THE EMERGENCY DEPARTMENT WITH COMPLAINTS OF CHEST PAIN FOR THE LAST 4-5 DAYS.  PATIENT COMPLAINS OF A PRESSURE IN THE CENTER OF HER CHEST WITH SOME ASSOCIATED TENDERNESS TO PALPATION.  SYMPTOMS WORSENED WITH DEEP INSPIRATION AND ALSO WHEN EATING.  PATIENT STATES THAT SHE THOUGHT IT MAYBE DUE TO INDIGESTION, BUT NOTHING REALLY SEEMS TO MAKE IT BETTER.  SHE DENIES ANY ASSOCIATED SHORTNESS OF BREATH OR COUGH.  SHE DENIES ANY ASSOCIATED FEVER OR CHILLS, ABDOMINAL PAIN, NAUSEA, VOMITING, DIARRHEA.  SHE STATES THAT SHE IS CURRENTLY BEING TREATED FOR URINARY TRACT INFECTION AND THAT SYMPTOMS ARE IMPROVING.  PATIENT DENIES ANY PREVIOUS CARDIAC ABNORMALITIES.  AT TIME OF EXAMINATION, PATIENT WAS RESTING COMFORTABLY AND IN NO APPARENT DISTRESS.  SHE DENIES ANY FURTHER COMPLAINTS.  PHYSICAL EXAMINATION DID REVEAL TENDERNESS TO PALPATION OVERLYING THE STERNUM.  HEART RATE AND RHYTHM WITHIN NORMAL LIMITS.  LUNGS WERE CLEAR TO AUSCULTATION BILATERALLY.  ABDOMEN IS SOFT AND NONDISTENDED WITH NO TENDERNESS TO PALPATION.  PATIENT HAD  NO SWELLING OF THE LOWER EXTREMITIES.  PATIENT IS MOST LIKELY SUFFERING FROM A COMBINATION OF MUSCULOSKELETAL CHEST PAIN AND ACID REFLUX.  SHE DOES ADMIT THAT SHE RECENTLY INCREASED HER DOSE OF OZEMPIC FROM 1 MG TO 2 MG LAST MONTH.  TYLENOL AND GI COCKTAIL ORDERED.  LAB  WORK AND IMAGING ORDERED.  PATIENT WAS STABLE.      ED Course as of 08/09/23 1502   Mon Aug 09, 2023   1243 CHEST X-RAY REVEALED:   FINDINGS:     The heart size is normal.   The lungs are clear.   There are clips in the left upper quadrant from prior surgery which are stable.        IMPRESSION:  NO ACUTE FINDINGS.   1246 WBC 8.9, HEMOGLOBIN 13.2, PLATELET COUNT 258   1306 SODIUM 138, POTASSIUM 4.6, BUN 18, CREATININE 0.78, GLUCOSE 222, TOTAL BILIRUBIN 0.4, AST 14, ALT 14, TOTAL ALKALINE PHOSPHATASE 80, LIPASE 53   1307 TROPONIN 3.  REPEAT TROPONIN HAS BEEN ORDERED.   1307 PTT 31.9, PT 11.1, INR 0.98   1307 GI COCKTAIL AND TYLENOL ORDERED   1403 REPEAT TROPONIN IN 1 HOUR WAS ONLY 2.  3 HOUR TROPONIN WAS CANCELED.   1442 ON REEXAMINATION, PATIENT WAS RESTING COMFORTABLY AND IN NO APPARENT DISTRESS.  SHE WAS COUNSELED AND EDUCATED ON LABORATORY AND IMAGING FINDINGS.  ALL QUESTIONS WERE ANSWERED TO SATISFACTION.  PATIENT STATES THAT SHE WAS RECENTLY TREATED FOR MUSCULOSKELETAL PAIN OF THE RIGHT RIBS BY HER PCP AND DID IMPROVE WITH MUSCLE RELAXERS.  ROBAXIN WAS ORDERED AND PRESCRIBED.  PATIENT WAS COUNSELED AND EDUCATED ON PROPER USE AND MOST COMMON SIDE EFFECTS.  PATIENT WAS PROVIDED WITH A REFERRAL FOR CARDIOLOGY AND INSTRUCTED TO FOLLOW UP AS SOON AS POSSIBLE FOR FURTHER EVALUATION.  SHE WAS COUNSELED AND EDUCATED ON SUPPORTIVE CARE AT HOME.  PATIENT INSTRUCTED TO FOLLOW UP WITH PCP IN THE NEXT 1-2 DAYS TO RECHECK SYMPTOMS AND WAS GIVEN VERY CLEAR INSTRUCTIONS TO RETURN TO THE EMERGENCY DEPARTMENT FOR ANY NEW OR WORSENING SYMPTOMS.  PATIENT VERBALIZED UNDERSTANDING.  PATIENT WAS SMILING AND STABLE AT TIME OF DISCHARGE.  ALL QUESTIONS WERE ANSWERED TO SATISFACTION.         Medications Administered in the ED   aluminum-magnesium hydroxide-simethicone (MAG-AL PLUS) 200-200-20 mg per 5 mL oral liquid (30 mL Oral Given 08/09/23 1407)     And   hyoscyamine (HYOSYNE) 0.125 mg per 5 mL oral liquid (10 mL Oral Given 08/09/23 1408)      And   lidocaine (XYLOCAINE) 2% oral topical viscous solution (15 mL Oral Given 08/09/23 1407)   acetaminophen (TYLENOL) tablet (650 mg Oral Given 08/09/23 1407)   methocarbamol (ROBAXIN) tablet (1,000 mg Oral Given 08/09/23 1456)       Following the history, physical exam, and ED workup, the patient was deemed stable and suitable for discharge. The patient/caregiver was advised to return to the ED for any new or worsening symptoms. Discharge medications, and follow-up instructions were discussed with the patient/caregiver in detail, who verbalizes understanding. The patient/caregiver is in agreement and is comfortable with the plan of care.    Disposition: Discharged         Current Discharge Medication List        START taking these medications.        Details   methocarbamoL 500 mg Tablet  Commonly known as: ROBAXIN   1,000 mg, Oral, EVERY 6 HOURS PRN  Qty: 40 Tablet  Refills: 0  CONTINUE these medications - NO CHANGES were made during your visit.        Details   albuterol sulfate 90 mcg/actuation oral inhaler  Commonly known as: PROVENTIL or VENTOLIN or PROAIR   1-2 Puffs, Inhalation  Refills: 0     alendronate 70 mg Tablet  Commonly known as: FOSAMAX   70 mg, Oral, EVERY 7 DAYS  Qty: 4 Tablet  Refills: 0     atorvastatin 80 mg Tablet  Commonly known as: LIPITOR   80 mg, Oral, DAILY  Refills: 0     BD UF Nano Pen Needle 32 gauge x 5/32" Needle  Generic drug: Pen Needle (Disposable)   USE AS DIRECTED WITH INSULIN PEN  Refills: 0     biotin 1 mg Capsule   Oral  Refills: 0     bisacodyL 5 mg Tablet, Delayed Release (E.C.)  Commonly known as: DULCOLAX   5 mg, Oral  Refills: 0     cetirizine 10 mg Tablet  Commonly known as: zyrTEC   10 mg, Oral, DAILY  Refills: 0     Cholecalciferol (Vitamin D3) 50 mcg (2,000 unit) Capsule   1 Tablet, Oral  Refills: 0     CRANBERRY POWDER ORAL   1 Tablet, Oral  Refills: 0     ergocalciferol (vitamin D2) 1,250 mcg (50,000 unit) Capsule  Commonly known as: DRISDOL   No dose,  route, or frequency recorded.  Refills: 0     FreeStyle Libre 3 Sensor Device  Generic drug: Blood-Glucose Sensor   APPLY EVERY 14 DAYS  Refills: 0     glimepiride 2 mg Tablet  Commonly known as: AMARYL   2 mg, Oral, 2 TIMES DAILY  Refills: 0     letrozole 2.5 mg Tablet  Commonly known as: FEMARA   2.5 mg, Oral, DAILY  Qty: 90 Tablet  Refills: 1     levothyroxine 100 mcg Tablet  Commonly known as: SYNTHROID   No dose, route, or frequency recorded.  Refills: 0     lisinopriL 20 mg Tablet  Commonly known as: PRINIVIL   No dose, route, or frequency recorded.  Refills: 0     metFORMIN 500 mg Tablet Sustained Release 24 hr  Commonly known as: GLUCOPHAGE XR   TAKE 2 TABLETS BY MOUTH 2 TIMES DAILY (WITH MEALS)  Refills: 0     montelukast 10 mg Tablet  Commonly known as: SINGULAIR   No dose, route, or frequency recorded.  Refills: 0     NovoLIN N FlexPen 100 unit/mL (3 mL) Insulin Pen subcutaneous pen  Generic drug: insulin NPH human   Refills: 0     ondansetron 4 mg Tablet, Rapid Dissolve  Commonly known as: ZOFRAN ODT   No dose, route, or frequency recorded.  Refills: 0     Osphena 60 mg Tablet  Generic drug: ospemifene   1 Tablet, Oral, DAILY  Refills: 0     oxyBUTYnin 5 mg Tablet Extended Rel 24 hr  Commonly known as: DITROPAN XL   5 mg, Oral, DAILY  Refills: 0     Ozempic 2 mg/dose (8 mg/3 mL) Pen Injector  Generic drug: semaglutide   2 mg, Subcutaneous, EVERY 7 DAYS  Refills: 0     pantoprazole 40 mg Tablet, Delayed Release (E.C.)  Commonly known as: PROTONIX   No dose, route, or frequency recorded.  Refills: 0     pioglitazone 30 mg Tablet  Commonly known as: ACTOS   No dose, route, or  frequency recorded.  Refills: 0     * sertraline 50 mg Tablet  Commonly known as: ZOLOFT   No dose, route, or frequency recorded.  Refills: 0     * sertraline 100 mg Tablet  Commonly known as: ZOLOFT   100 mg, Oral, DAILY  Refills: 0     SUMAtriptan 50 mg Tablet  Commonly known as: IMITREX   No dose, route, or frequency  recorded.  Refills: 0     verapamiL 300 mg Capsule, 24hr ER Pellet CT  Commonly known as: VERELAN PM   No dose, route, or frequency recorded.  Refills: 0           * This list has 2 medication(s) that are the same as other medications prescribed for you. Read the directions carefully, and ask your doctor or other care provider to review them with you.                Follow up:   Mariah Milling, DO  [address]  [place] [address]  [phone]    In 1 day      [facility] - Emergency Department  [address]  [place] [address]  [phone]    As needed, If symptoms worsen    Ward, Jeannett Senior, MD  [address]  [address]  [place] [address]  [phone]    In 1 day                 Clinical Impression   Musculoskeletal chest pain (Primary)   Gastroesophageal reflux disease, unspecified whether esophagitis present   Uncontrolled type 2 diabetes mellitus with hyperglycemia (CMS HCC)   Hypertension, unspecified type         Discharge Medication List as of 08/09/2023  2:42 PM        START taking these medications    Details   methocarbamoL (ROBAXIN) 500 mg Oral Tablet Take 2 Tablets (1,000 mg total) by mouth Every 6 hours as needed (MUSCLE SPASM), Disp-40 Tablet, R-0, E-Rx               Tawanna Sat, DO

## 2023-08-09 NOTE — ED Nurses Note (Signed)
Patient seen and discharged by provider.

## 2023-08-11 DIAGNOSIS — R079 Chest pain, unspecified: Secondary | ICD-10-CM

## 2023-08-11 DIAGNOSIS — R9431 Abnormal electrocardiogram [ECG] [EKG]: Secondary | ICD-10-CM

## 2023-08-11 LAB — ECG 12 LEAD
Atrial Rate: 72 {beats}/min
Calculated P Axis: 72 degrees
Calculated R Axis: 82 degrees
Calculated T Axis: 109 degrees
PR Interval: 198 ms
QRS Duration: 80 ms
QT Interval: 350 ms
QTC Calculation: 383 ms
Ventricular rate: 72 {beats}/min

## 2023-11-02 ENCOUNTER — Other Ambulatory Visit: Payer: Self-pay

## 2023-11-02 ENCOUNTER — Ambulatory Visit (INDEPENDENT_AMBULATORY_CARE_PROVIDER_SITE_OTHER): Payer: Self-pay | Admitting: NEUROLOGY

## 2023-11-02 ENCOUNTER — Encounter (INDEPENDENT_AMBULATORY_CARE_PROVIDER_SITE_OTHER): Payer: Self-pay | Admitting: NEUROLOGY

## 2023-11-02 VITALS — BP 118/60 | HR 90 | Temp 96.6°F | Ht 64.0 in | Wt 152.2 lb

## 2023-11-02 MED ORDER — RIZATRIPTAN 10 MG DISINTEGRATING TABLET
10.0000 mg | ORAL_TABLET | Freq: Once | ORAL | 2 refills | Status: AC | PRN
Start: 2023-11-02 — End: 2024-01-31

## 2023-11-02 NOTE — Progress Notes (Signed)
 Assessment & Plan  Falls frequently  She is a 68 year old woman who is referred for ataxia with frequent falls. Descriptions of her falls are not very detailed though she denies any presyncopal feelings when this happens. Her exam is largely intact though there is some mild to moderate large fiber sensory loss in the lower extremities. This would typically be concerning for polyneuropathy given longstanding diabetes, however, she is generally hyperreflexic. Her brain MRI shows significant white matter disease with some confluent areas concerning for possible demyelinating changes. She reports being previously worked up for multiple sclerosis about 15 years ago including a lumbar puncture that was unremarkable. I would like to obtain prior MRIs for comparison. We will also obtain an MRI of the cervical spine to eval for cord signal abnormalities given her exam and ataxia. Will plan for EMG to eval for polyneuropathy.     - referral to physical therapy  - obtain NCS/EMG  - obtain MRI cervical spine  - obtain prior MRIs of the brain    Migraine without aura  She reports longstanding history of migraines. Typically frontal or temporal pain associated with photo/phonophobia lasting hours at a time. These improved significantly with addition of verapamil. She gets 1 headache per week at this point. She has used oral sumatriptan which is largely not beneficial. We will trial rizatriptan  though may consider switching to nasal sumatriptan as that has had some efficacy in the past.     - continue verapamil  - discontinue sumatriptan  - trial rizatriptan  10 mg ODT PRN  - Encouraged aggressive hydration    RTC electrodiagnostic testing    Thank you for allowing me to participate in your patient's care and please do not hesitate to contact me for any questions or concerns.    S. Zach Jubal Rademaker, DO  Assistant Professor of Neurology  [place]  [facility]     I personally spent a total of 60 minutes  today preparing to see the patient, in the encounter with the patient, and documenting after the visit.    [id number] Rush and/or [facility] provider will continue to be the provider focal point in managing the chronic complex neurological condition  ==========================================================================================================================================    NAME:  Jasmine Bates  DOB:  September 18, 1955  VISIT DATE:  11/02/2023    CC:  Falls    Patient seen in consultation at the request of Baker Bon, FNP  History obtained from the patient and chart/records  Age of patient:  68 y.o.      HPI:   I had the pleasure of seeing your patient in neurology clinic for an outpatient consultation, who is a 68 y.o. year old female who was referred for evaluation of falls.  Please allow me to summarize the history for the record.    She is joined by husband who provides additional history. She has been struggling with gait instability for 2-3 years at this point. She reports rather frequent falls without clear trigger. She has difficulty describing the mechanics of her falls but she denies significant dizziness or presyncope prior to falling. She does feel that sometimes her legs give out or don't support her. She denies any numbness in the lower extremities. She occasionally will have some shooting pains in the feet. She has had diabetes for 15 years or more and has only been controlled in the last 2-3 years. Has had A1c's of 13 in the past. She reports prior abnormal brain MRIs. She reports undergoing  a lumbar puncture for multiple sclerosis which was negative. She denies any history of prior focal neurologic deficits including visual disturbance.   She also reports history of migraines. This has been present for many years. They were frequent and severe until being placed on verapamil which helped significantly. They have increased in frequency. She is currently having 1 per week. She has trialed oral  sumatriptan with little effect. Has had better but not complete response to nasal sumatriptan.       ============================================================================================================================================  PMHx  Patient Active Problem List   Diagnosis    Malignant neoplasm of breast    Dizziness    Hearing loss    Falls frequently    Osteopenia     Past Surgical History:   Procedure Laterality Date    CESAREAN SECTION      HX BREAST LUMPECTOMY Right     10/2018 positive  pt had radiation    HX GASTRIC SLEEVE           Family Medical History:       Problem Relation (Age of Onset)    Breast Cancer Other    Hypertension (High Blood Pressure) Father    Lung Cancer Mother    Melanoma Mother    No Known Problems Sister, Brother, Maternal Aunt, Maternal Uncle, Paternal Aunt, Paternal Uncle, Maternal Grandmother, Maternal Grandfather, Paternal Grandmother, Paternal Grandfather, Daughter, Son            Current Outpatient Medications   Medication Sig Dispense Refill    albuterol sulfate (PROVENTIL OR VENTOLIN OR PROAIR) 90 mcg/actuation Inhalation oral inhaler Take 1-2 Puffs by inhalation      alendronate  (FOSAMAX ) 70 mg Oral Tablet TAKE 1 TABLET (70 MG TOTAL) BY MOUTH EVERY 7 DAYS 4 Tablet 0    atorvastatin (LIPITOR) 80 mg Oral Tablet Take 1 Tablet (80 mg total) by mouth Daily      BD UF NANO PEN NEEDLE 32 gauge x 5/32" Needle USE AS DIRECTED WITH INSULIN PEN      biotin 1 mg Oral Capsule Take by mouth      bisacodyL (DULCOLAX) 5 mg Oral Tablet, Delayed Release (E.C.) Take 1 Tablet (5 mg total) by mouth      cetirizine (ZYRTEC) 10 mg Oral Tablet Take 1 Tablet (10 mg total) by mouth Daily      Cholecalciferol, Vitamin D3, 50 mcg (2,000 unit) Oral Capsule Take 1 Capsule by mouth      cranberry fruit extract (CRANBERRY POWDER ORAL) Take 1 Tablet by mouth      ergocalciferol, vitamin D2, (DRISDOL) 1,250 mcg (50,000 unit) Oral Capsule       FREESTYLE LIBRE 3 SENSOR Does not apply Device APPLY  EVERY 14 DAYS      glimepiride (AMARYL) 2 mg Oral Tablet Take 1 Tablet (2 mg total) by mouth Twice daily      letrozole  (FEMARA ) 2.5 mg Oral Tablet Take 1 Tablet (2.5 mg total) by mouth Once a day for 180 days 90 Tablet 1    levothyroxine (SYNTHROID) 100 mcg Oral Tablet       lisinopriL (PRINIVIL) 20 mg Oral Tablet       metFORMIN (GLUCOPHAGE XR) 500 mg Oral Tablet Sustained Release 24 hr TAKE 2 TABLETS BY MOUTH 2 TIMES DAILY (WITH MEALS)      methocarbamoL  (ROBAXIN ) 500 mg Oral Tablet Take 2 Tablets (1,000 mg total) by mouth Every 6 hours as needed (MUSCLE SPASM) 40 Tablet 0    montelukast (SINGULAIR) 10 mg  Oral Tablet       NOVOLIN N FLEXPEN 100 unit/mL (3 mL) Subcutaneous Insulin Pen subcutaneous pen       ondansetron (ZOFRAN ODT) 4 mg Oral Tablet, Rapid Dissolve       oxyBUTYnin (DITROPAN XL) 5 mg Oral Tablet Extended Rel 24 hr Take 1 Tablet (5 mg total) by mouth Daily      OZEMPIC 2 mg/dose (8 mg/3 mL) Subcutaneous Pen Injector Inject 2 mg under the skin Every 7 days      pantoprazole (PROTONIX) 40 mg Oral Tablet, Delayed Release (E.C.)       rizatriptan  (MAXALT -MLT) 10 mg Oral Tablet, Rapid Dissolve Take 1 Tablet (10 mg total) by mouth Once, as needed for Migraine for up to 90 days May repeat in 2 hours if needed. 10 Tablet 2    sertraline (ZOLOFT) 100 mg Oral Tablet Take 1 Tablet (100 mg total) by mouth Daily      verapamiL (VERELAN PM) 300 mg Oral Capsule, 24hr ER Pellet CT        No current facility-administered medications for this visit.     Allergies   Allergen Reactions    Nsaids (Non-Steroidal Anti-Inflammatory Drug) Anaphylaxis    Sulfamethoxazole-Trimethoprim Angioedema and Rash    Aspirin     Sulfa (Sulfonamides)      Social History     Socioeconomic History    Marital status: Married     Spouse name: Not on file    Number of children: Not on file    Years of education: Not on file    Highest education level: Not on file   Occupational History    Not on file   Tobacco Use    Smoking status: Never     Smokeless tobacco: Never   Vaping Use    Vaping status: Never Used   Substance and Sexual Activity    Alcohol use: Never    Drug use: Never    Sexual activity: Not on file   Other Topics Concern    Not on file   Social History Narrative    Not on file     Social Determinants of Health     Financial Resource Strain: Not on file   Transportation Needs: Not on file   Social Connections: Not on file   Intimate Partner Violence: Not on file   Housing Stability: Not on file       ============================================================================================================================================  GENERAL EXAMINATION  BP 118/60 (Site: Left Arm, Patient Position: Sitting)   Pulse 90   Temp (!) 35.9 C (96.6 F) (Temporal)   Ht 1.626 m (5\' 4" )   Wt 69 kg (152 lb 3.2 oz)   SpO2 94%   BMI 26.13 kg/m     Vital signs personally reviewed  General: No acute distress, alert  HEENT: Normocephalic, no scleral icterus  Extremities: No significant edema, No cyanosis    NEUROLOGIC EXAM  On neurological exam, patient was awake, alert and answering questions appropriately  Speech was fluent, without dysarthria or aphasia.    CN  II: not directly tested, grossly intact  III, IV, VI: extraocular movements intact without nystagmus  V: intact to light touch  VII: face symmetric without weakness  VIII: grossly intact  IX, X: symmetric palatal elevation  XI: normal strength of trapezius and sternocleidomastoid bilaterally  XII: tongue midline with full movements    MOTOR  Bulk: normal  Abnormal Movements: none    Strength:   MRC Grading Scale   Right  Left   Deltoid 5 5   Biceps 5 5   Triceps 5 5   Wrist Extension 5 5   Wrist Flexion - -   Finger Extension 5 5   Finger Abduction 5 5   Finger Flexion 5 5   Hip Flexion 5 5   Hip Extension - -   Hip Abduction - -   Hip Adduction - -   Knee Extension 5 5   Knee Flexion 5 5   Ankle Dorsiflexion 5 5   Ankle Plantarflexion 5 5   Toe Extension 5 5   Toe Flexion - -      REFLEXES   Right Left   Biceps 3 3   Triceps - -   Brachioradialis 2 2   Patellar 3 3   Achilles 2 2   Plantar - -   Hoffman Positive positive   Pectoralis - -   Jaw Jerk - -       SENSORY  Light touch: intact throughout  Vibration: moderately diminished at toes, largely intact at ankles    GAIT  General: ataxic, near fall when turning, not particularly wide based    COORDINATION  Finger nose finger: left dysmetria    ================================================================================================================================LABS  Personal Review of prior labs is notable for:   2024  A1c 7.0 (high 10.3)  TSH WNL   IMAGING  Personal Review of imaging is notable for:   MRI Brain w/wo June 2024 - severe white matter ds, many juxtacortical, some confluent areas    OTHER DIAGNOSTICS  Personal Review of other prior diagnostics is notable for: not applicable

## 2023-11-02 NOTE — Assessment & Plan Note (Signed)
 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 Descriptions of her falls are not very detailed though she denies any presyncopal feelings when this happens. Her exam is largely intact though there is some mild to moderate large fiber sensory loss in the lower extremities. This would typically be concerning for polyneuropathy given longstanding diabetes, however, she is generally hyperreflexic. Her brain MRI shows significant white matter disease with some confluent areas concerning for possible demyelinating changes. She reports being previously worked up for multiple sclerosis about 15 years ago including a lumbar puncture that was unremarkable. I would like to obtain prior MRIs for comparison. We will also obtain an MRI of the cervical spine to eval for cord signal abnormalities given her exam and ataxia. Will plan for EMG to eval for polyneuropathy.     - referral to physical therapy  - obtain NCS/EMG  - obtain MRI cervical spine  - obtain prior MRIs of the brain

## 2023-11-12 ENCOUNTER — Telehealth (INDEPENDENT_AMBULATORY_CARE_PROVIDER_SITE_OTHER): Payer: Self-pay | Admitting: NEUROLOGY

## 2023-11-12 NOTE — Telephone Encounter (Signed)
 Called to tell patient when her MRI was booked did not get through to anyone. I left a detailed message with date, time and location and my name and return number  Reynaldo Cavalier, [place]  11/12/2023 10:25

## 2023-11-23 ENCOUNTER — Encounter (HOSPITAL_COMMUNITY): Payer: Self-pay

## 2023-11-23 ENCOUNTER — Ambulatory Visit
Admission: RE | Admit: 2023-11-23 | Discharge: 2023-11-23 | Disposition: A | Discharge status: Home / Self Care | Payer: Self-pay | Source: Ambulatory Visit | Attending: NURSE PRACTITIONER | Admitting: NURSE PRACTITIONER

## 2023-11-23 ENCOUNTER — Other Ambulatory Visit: Payer: Self-pay

## 2023-11-23 DIAGNOSIS — Z170 Estrogen receptor positive status [ER+]: Secondary | ICD-10-CM | POA: Insufficient documentation

## 2023-11-23 DIAGNOSIS — C50911 Malignant neoplasm of unspecified site of right female breast: Secondary | ICD-10-CM | POA: Insufficient documentation

## 2023-11-23 DIAGNOSIS — R92333 Mammographic heterogeneous density, bilateral breasts: Secondary | ICD-10-CM | POA: Insufficient documentation

## 2023-11-25 DIAGNOSIS — Z170 Estrogen receptor positive status [ER+]: Secondary | ICD-10-CM

## 2023-11-25 DIAGNOSIS — C50911 Malignant neoplasm of unspecified site of right female breast: Secondary | ICD-10-CM

## 2023-11-25 DIAGNOSIS — R92333 Mammographic heterogeneous density, bilateral breasts: Secondary | ICD-10-CM

## 2023-11-25 DIAGNOSIS — R921 Mammographic calcification found on diagnostic imaging of breast: Secondary | ICD-10-CM

## 2023-11-30 ENCOUNTER — Other Ambulatory Visit (INDEPENDENT_AMBULATORY_CARE_PROVIDER_SITE_OTHER): Payer: Self-pay | Admitting: NURSE PRACTITIONER

## 2023-11-30 DIAGNOSIS — Z170 Estrogen receptor positive status [ER+]: Secondary | ICD-10-CM

## 2023-12-07 ENCOUNTER — Other Ambulatory Visit: Payer: Self-pay

## 2023-12-07 ENCOUNTER — Ambulatory Visit
Admission: RE | Admit: 2023-12-07 | Discharge: 2023-12-07 | Disposition: A | Discharge status: Home / Self Care | Payer: Self-pay | Source: Ambulatory Visit | Attending: NEUROLOGY | Admitting: NEUROLOGY

## 2023-12-07 DIAGNOSIS — G959 Disease of spinal cord, unspecified: Secondary | ICD-10-CM

## 2024-01-05 ENCOUNTER — Ambulatory Visit (INDEPENDENT_AMBULATORY_CARE_PROVIDER_SITE_OTHER): Payer: Self-pay | Admitting: NURSE PRACTITIONER

## 2024-01-05 ENCOUNTER — Encounter (INDEPENDENT_AMBULATORY_CARE_PROVIDER_SITE_OTHER): Payer: Self-pay | Admitting: NURSE PRACTITIONER

## 2024-01-05 ENCOUNTER — Ambulatory Visit (HOSPITAL_COMMUNITY)
Admission: RE | Admit: 2024-01-05 | Discharge: 2024-01-05 | Disposition: A | Discharge status: Home / Self Care | Source: Ambulatory Visit | Attending: NEUROLOGY | Admitting: NEUROLOGY

## 2024-01-05 ENCOUNTER — Ambulatory Visit: Payer: Self-pay | Attending: NURSE PRACTITIONER | Admitting: NURSE PRACTITIONER

## 2024-01-05 ENCOUNTER — Other Ambulatory Visit: Payer: Self-pay

## 2024-01-05 ENCOUNTER — Ambulatory Visit (INDEPENDENT_AMBULATORY_CARE_PROVIDER_SITE_OTHER)
Admission: RE | Admit: 2024-01-05 | Discharge: 2024-01-05 | Disposition: A | Discharge status: Home / Self Care | Payer: Self-pay | Source: Ambulatory Visit | Attending: NURSE PRACTITIONER | Admitting: NURSE PRACTITIONER

## 2024-01-05 VITALS — BP 129/57 | HR 64 | Temp 98.6°F | Ht 64.0 in | Wt 152.1 lb

## 2024-01-05 DIAGNOSIS — Z9889 Other specified postprocedural states: Secondary | ICD-10-CM | POA: Insufficient documentation

## 2024-01-05 DIAGNOSIS — G959 Disease of spinal cord, unspecified: Secondary | ICD-10-CM | POA: Insufficient documentation

## 2024-01-05 DIAGNOSIS — Z86018 Personal history of other benign neoplasm: Secondary | ICD-10-CM | POA: Insufficient documentation

## 2024-01-05 DIAGNOSIS — Z9884 Bariatric surgery status: Secondary | ICD-10-CM | POA: Insufficient documentation

## 2024-01-05 DIAGNOSIS — R531 Weakness: Secondary | ICD-10-CM | POA: Insufficient documentation

## 2024-01-05 DIAGNOSIS — Z79899 Other long term (current) drug therapy: Secondary | ICD-10-CM | POA: Insufficient documentation

## 2024-01-05 DIAGNOSIS — Z9011 Acquired absence of right breast and nipple: Secondary | ICD-10-CM | POA: Insufficient documentation

## 2024-01-05 DIAGNOSIS — Z170 Estrogen receptor positive status [ER+]: Secondary | ICD-10-CM

## 2024-01-05 DIAGNOSIS — C50911 Malignant neoplasm of unspecified site of right female breast: Secondary | ICD-10-CM

## 2024-01-05 DIAGNOSIS — Z853 Personal history of malignant neoplasm of breast: Secondary | ICD-10-CM | POA: Insufficient documentation

## 2024-01-05 DIAGNOSIS — M79604 Pain in right leg: Secondary | ICD-10-CM | POA: Insufficient documentation

## 2024-01-05 DIAGNOSIS — M8580 Other specified disorders of bone density and structure, unspecified site: Secondary | ICD-10-CM | POA: Insufficient documentation

## 2024-01-05 DIAGNOSIS — Z923 Personal history of irradiation: Secondary | ICD-10-CM | POA: Insufficient documentation

## 2024-01-05 DIAGNOSIS — Z08 Encounter for follow-up examination after completed treatment for malignant neoplasm: Secondary | ICD-10-CM | POA: Insufficient documentation

## 2024-01-05 DIAGNOSIS — Z79810 Long term (current) use of selective estrogen receptor modulators (SERMs): Secondary | ICD-10-CM | POA: Insufficient documentation

## 2024-01-05 LAB — CBC WITH DIFF
BASOPHIL #: 0.1 10*3/uL (ref 0.00–0.10)
BASOPHIL %: 1 % (ref 0–1)
EOSINOPHIL #: 0.5 10*3/uL (ref 0.00–0.50)
EOSINOPHIL %: 7 % (ref 1–7)
HCT: 37.4 % (ref 31.2–41.9)
HGB: 12.4 g/dL (ref 10.9–14.3)
LYMPHOCYTE #: 2.3 10*3/uL (ref 1.10–3.10)
LYMPHOCYTE %: 33 % (ref 16–46)
MCH: 27.2 pg (ref 24.7–32.8)
MCHC: 33.2 g/dL (ref 32.3–35.6)
MCV: 81.8 fL (ref 75.5–95.3)
MONOCYTE #: 0.5 10*3/uL (ref 0.20–0.90)
MONOCYTE %: 8 % (ref 4–11)
MPV: 8.9 fL (ref 7.9–10.8)
NEUTROPHIL #: 3.4 10*3/uL (ref 1.90–8.20)
NEUTROPHIL %: 51 % (ref 43–77)
PLATELETS: 227 10*3/uL (ref 140–440)
RBC: 4.57 10*6/uL (ref 3.63–4.92)
RDW: 14.6 % (ref 12.3–17.7)
WBC: 6.8 10*3/uL (ref 3.8–11.8)

## 2024-01-05 LAB — COMPREHENSIVE METABOLIC PANEL, NON-FASTING
ALBUMIN/GLOBULIN RATIO: 1.3 (ref 0.8–1.4)
ALBUMIN: 4.2 g/dL (ref 3.5–5.7)
ALKALINE PHOSPHATASE: 71 U/L (ref 34–104)
ALT (SGPT): 13 U/L (ref 7–52)
ANION GAP: 8 mmol/L (ref 4–13)
AST (SGOT): 17 U/L (ref 13–39)
BILIRUBIN TOTAL: 0.4 mg/dL (ref 0.3–1.0)
BUN/CREA RATIO: 21 (ref 6–22)
BUN: 15 mg/dL (ref 7–25)
CALCIUM, CORRECTED: 8.8 mg/dL — ABNORMAL LOW (ref 8.9–10.8)
CALCIUM: 9 mg/dL (ref 8.6–10.3)
CHLORIDE: 107 mmol/L (ref 98–107)
CO2 TOTAL: 25 mmol/L (ref 21–31)
CREATININE: 0.7 mg/dL (ref 0.60–1.30)
ESTIMATED GFR: 96 mL/min/{1.73_m2} (ref >59)
GLOBULIN: 3.3 (ref 2.0–3.5)
GLUCOSE: 216 mg/dL — ABNORMAL HIGH (ref 74–109)
OSMOLALITY, CALCULATED: 287 mosm/kg (ref 270–290)
POTASSIUM: 4.2 mmol/L (ref 3.5–5.1)
PROTEIN TOTAL: 7.5 g/dL (ref 6.4–8.9)
SODIUM: 140 mmol/L (ref 136–145)

## 2024-01-05 NOTE — Cancer Center Note (Signed)
 Department of Hematology/Oncology  Return Patient Visit       Jasmine Bates  [id number]  [birth year]   12/01/2021    DIAGNOSIS: Breast cancer.    HISTORY OF PRESENT ILLNESS:  Jasmine Bates is a 68 y.o. female who presents to today  for follow up regarding her  Breast cancer.   Patient was switched to Anastrazole on 06/04/21 due to complaints of excessive hot flashes but did not decrease the hot flashes. She requested to resume Letrozole  on 12/01/21.    She was under the care of [facility], with an epidural January of 2024 . Recurrent falls under the care of Dr. Reinhold Bates, Neurology with documented plans for EMG to evaluate for polyneuropathy.      REVIEW OF SYSTEMS:  Review of Systems   Constitutional:  Positive for fatigue. Negative for appetite change, chills and fever.   Eyes:  Positive for eye problems (burn and itch).   Respiratory:  Negative for cough and shortness of breath.    Cardiovascular:  Negative for chest pain, leg swelling and palpitations.   Gastrointestinal:  Positive for abdominal pain (mild epigastric region) and nausea (at times with eating). Negative for blood in stool and diarrhea.   Endocrine: Positive for hot flashes (improving from previous report Mild and rare).   Genitourinary:  Negative for difficulty urinating and dyspareunia.    Musculoskeletal:  Positive for back pain (Mild).        No recent falls    Skin: Negative.    Neurological:  Positive for dizziness (feeling she is spinning when getting up quickly) and extremity weakness (improved).   Hematological:  Negative for adenopathy. Does not bruise/bleed easily.   Psychiatric/Behavioral:  Positive for sleep disturbance (trouble falling asleep at night). Negative for depression. The patient is not nervous/anxious.          Past Medical History:   Diagnosis Date    BRCA1 gene mutation negative     BRCA2 gene mutation negative     Breast CA 2020    right lumpectomy/radiation/arimadex    COPD (chronic obstructive pulmonary disease)      Diabetes mellitus, type 2     Essential hypertension     Fatty liver     Hyperlipidemia     Hypothyroidism     Malignant neoplasm of breast 12/01/2021    Osteoarthritis     Piriformis syndrome     Sacroiliitis (CMS HCC)      Past Surgical History:   Procedure Laterality Date    CESAREAN SECTION      HX BREAST LUMPECTOMY Right     10/2018 positive  pt had radiation    HX GASTRIC SLEEVE       Social History     Socioeconomic History    Marital status: Married     Spouse name: Not on file    Number of children: Not on file    Years of education: Not on file    Highest education level: Not on file   Occupational History    Not on file   Tobacco Use    Smoking status: Never    Smokeless tobacco: Never   Vaping Use    Vaping status: Never Used   Substance and Sexual Activity    Alcohol use: Never    Drug use: Never    Sexual activity: Not on file   Other Topics Concern    Not on file   Social History Narrative  Not on file     Social Determinants of Health     Financial Resource Strain: Not on file   Transportation Needs: Not on file   Social Connections: Not on file   Intimate Partner Violence: Not on file   Housing Stability: Not on file     Social History     Social History Narrative    Not on file     Social History     Substance and Sexual Activity   Drug Use Never     Family Medical History:       Problem Relation (Age of Onset)    Breast Cancer Other    Hypertension (High Blood Pressure) Father    Lung Cancer Mother    Melanoma Mother    No Known Problems Sister, Brother, Maternal Aunt, Maternal Uncle, Paternal Aunt, Paternal Uncle, Maternal Grandmother, Maternal Grandfather, Paternal Grandmother, Paternal Grandfather, Daughter, Son          Current Outpatient Medications   Medication Sig    albuterol sulfate (PROVENTIL OR VENTOLIN OR PROAIR) 90 mcg/actuation Inhalation oral inhaler Take 1-2 Puffs by inhalation    alendronate  (FOSAMAX ) 70 mg Oral Tablet TAKE 1 TABLET (70 MG TOTAL) BY MOUTH EVERY 7 DAYS     atorvastatin (LIPITOR) 80 mg Oral Tablet Take 1 Tablet (80 mg total) by mouth Daily    BD UF NANO PEN NEEDLE 32 gauge x 5/32" Needle USE AS DIRECTED WITH INSULIN PEN    biotin 1 mg Oral Capsule Take by mouth    bisacodyL (DULCOLAX) 5 mg Oral Tablet, Delayed Release (E.C.) Take 1 Tablet (5 mg total) by mouth    cetirizine (ZYRTEC) 10 mg Oral Tablet Take 1 Tablet (10 mg total) by mouth Daily    Cholecalciferol, Vitamin D3, 50 mcg (2,000 unit) Oral Capsule Take 1 Capsule by mouth    cranberry fruit extract (CRANBERRY POWDER ORAL) Take 1 Tablet by mouth    ergocalciferol, vitamin D2, (DRISDOL) 1,250 mcg (50,000 unit) Oral Capsule     FREESTYLE LIBRE 3 SENSOR Does not apply Device APPLY EVERY 14 DAYS    glimepiride (AMARYL) 4 mg Oral Tablet Take 1 Tablet (4 mg total) by mouth Twice daily    letrozole  (FEMARA ) 2.5 mg Oral Tablet TAKE 1 TABLET BY MOUTH EVERY DAY    levothyroxine (SYNTHROID) 100 mcg Oral Tablet     metFORMIN (GLUCOPHAGE XR) 500 mg Oral Tablet Sustained Release 24 hr TAKE 2 TABLETS BY MOUTH 2 TIMES DAILY (WITH MEALS)    methocarbamoL  (ROBAXIN ) 500 mg Oral Tablet Take 2 Tablets (1,000 mg total) by mouth Every 6 hours as needed (MUSCLE SPASM)    montelukast (SINGULAIR) 10 mg Oral Tablet     NOVOLIN N FLEXPEN 100 unit/mL (3 mL) Subcutaneous Insulin Pen subcutaneous pen     ondansetron (ZOFRAN ODT) 4 mg Oral Tablet, Rapid Dissolve     oxyBUTYnin (DITROPAN XL) 5 mg Oral Tablet Extended Rel 24 hr Take 1 Tablet (5 mg total) by mouth Daily    OZEMPIC 2 mg/dose (8 mg/3 mL) Subcutaneous Pen Injector Inject 2 mg under the skin Every 7 days    pantoprazole (PROTONIX) 40 mg Oral Tablet, Delayed Release (E.C.)     rizatriptan  (MAXALT -MLT) 10 mg Oral Tablet, Rapid Dissolve Take 1 Tablet (10 mg total) by mouth Once, as needed for Migraine for up to 90 days May repeat in 2 hours if needed.    sertraline (ZOLOFT) 100 mg Oral Tablet Take 1 Tablet (100 mg total)  by mouth Daily    verapamiL (VERELAN PM) 300 mg Oral Capsule, 24hr  ER Pellet CT      Allergies   Allergen Reactions    Nsaids (Non-Steroidal Anti-Inflammatory Drug) Anaphylaxis    Sulfamethoxazole-Trimethoprim Angioedema and Rash    Aspirin     Sulfa (Sulfonamides)      VITAL SIGNS:  BP (!) 129/57 (Site: Left Arm, Patient Position: Sitting, Cuff Size: Adult)   Pulse 64   Temp 37 C (98.6 F) (Temporal)   Ht 1.626 m (5\' 4" )   Wt 69 kg (152 lb 1.6 oz)   SpO2 98%   BMI 26.11 kg/m   ECOG Status: (0) Fully active, able to carry on all predisease performance without restriction.    Physical Exam  Vitals and nursing note reviewed.   Constitutional:       Appearance: Normal appearance.   HENT:      Head: Normocephalic and atraumatic.      Right Ear: Tympanic membrane, ear canal and external ear normal. There is no impacted cerumen.      Left Ear: Tympanic membrane, ear canal and external ear normal. There is no impacted cerumen.      Nose: Nose normal.      Mouth/Throat:      Mouth: Mucous membranes are moist.      Tongue: Tongue does not deviate from midline.   Eyes:      General: No scleral icterus.        Right eye: No discharge.         Left eye: No discharge.      Extraocular Movements: Extraocular movements intact.      Conjunctiva/sclera: Conjunctivae normal.      Pupils: Pupils are equal, round, and reactive to light.   Cardiovascular:      Rate and Rhythm: Normal rate and regular rhythm.      Heart sounds: Normal heart sounds, S1 normal and S2 normal. No murmur heard.     No friction rub.   Pulmonary:      Effort: Pulmonary effort is normal.      Breath sounds: Normal breath sounds. No wheezing or rhonchi.   Chest:   Breasts:     Right: Normal.      Left: Normal.          Comments: Declines exam today   Abdominal:      General: Bowel sounds are normal.      Palpations: Abdomen is soft.      Tenderness: There is no abdominal tenderness.   Musculoskeletal:         General: No tenderness or deformity. Normal range of motion.      Cervical back: Normal range of motion and neck  supple. No bony tenderness.      Thoracic back: No bony tenderness.      Lumbar back: No bony tenderness.      Right lower leg: No edema.      Left lower leg: No edema.   Lymphadenopathy:      Cervical: No cervical adenopathy.      Upper Body:      Right upper body: No supraclavicular or axillary adenopathy.      Left upper body: No supraclavicular or axillary adenopathy.   Skin:     General: Skin is warm and dry.      Capillary Refill: Capillary refill takes 2 to 3 seconds.      Findings: No bruising or erythema.   Neurological:  General: No focal deficit present.      Mental Status: She is alert and oriented to person, place, and time.      Sensory: Sensation is intact.      Motor: Motor function is intact. No weakness.      Coordination: Coordination is intact. Coordination normal.      Gait: Gait is intact. Gait normal.      Comments: Negative for Nystagmus    Psychiatric:         Attention and Perception: Attention and perception normal.         Mood and Affect: Mood and affect normal.         Speech: Speech normal.         Behavior: Behavior normal. Behavior is cooperative.         Thought Content: Thought content normal.         Cognition and Memory: Cognition and memory normal.         Judgment: Judgment normal.      DIAGNOSTIC DATA:    LABS  CBC  Diff   Lab Results   Component Value Date/Time    WBC 6.8 01/05/2024 10:08 AM    HGB 12.4 01/05/2024 10:08 AM    HCT 37.4 01/05/2024 10:08 AM    PLTCNT 227 01/05/2024 10:08 AM    RBC 4.57 01/05/2024 10:08 AM    MCV 81.8 01/05/2024 10:08 AM    MCHC 33.2 01/05/2024 10:08 AM    MCH 27.2 01/05/2024 10:08 AM    RDW 14.6 01/05/2024 10:08 AM    MPV 8.9 01/05/2024 10:08 AM    Lab Results   Component Value Date/Time    PMNS 51 01/05/2024 10:08 AM    LYMPHOCYTES 33 01/05/2024 10:08 AM    EOSINOPHIL 7 01/05/2024 10:08 AM    MONOCYTES 8 01/05/2024 10:08 AM    BASOPHILS 1 01/05/2024 10:08 AM    BASOPHILS 0.10 01/05/2024 10:08 AM    PMNABS 3.40 01/05/2024 10:08 AM     LYMPHSABS 2.30 01/05/2024 10:08 AM    EOSABS 0.50 01/05/2024 10:08 AM    MONOSABS 0.50 01/05/2024 10:08 AM            Comprehensive Metabolic Profile    Lab Results   Component Value Date    SODIUM 140 01/05/2024    POTASSIUM 4.2 01/05/2024    CHLORIDE 107 01/05/2024    CO2 25 01/05/2024    ANIONGAP 8 01/05/2024    BUN 15 01/05/2024    CREATININE 0.70 01/05/2024    ALBUMIN 4.2 01/05/2024    CALCIUM 9.0 01/05/2024    GLUCOSENF 216 (H) 01/05/2024    ALKPHOS 71 01/05/2024    ALT 13 01/05/2024    AST 17 01/05/2024    TOTBILIRUBIN 0.4 01/05/2024    TOTALPROTEIN 7.5 01/05/2024         ESTIMATED GFR   Date Value Ref Range Status   01/05/2024 96 >59 mL/min/1.22m^2 Final     CA 27.29   Date Value Ref Range Status   07/07/2023 14 <38 U/mL Final     Comment:        The CA 27.29 result may be increased on average  5-10%, relative to results previously obtained  with this method due to a recent calibrator  adjustment made in October 2024 by the reagent  manufacturer. In the low range for this assay  (<38 U/mL), this increase may be greater than  20%. Serially monitored results should always  be used  in conjunction with other diagnostic  procedures, including clinical evaluation.     This test was performed using the Siemens chemilumi-  nescent method. Values obtained from different assay  methods cannot be used interchangeably. CA27.29  levels, regardless of value, should not be interpreted  as absolute evidence of the presence or absence of  disease.                Pathology:  None to Review today    Radiology:    11/25/2023 Mammogram-   The breasts are heterogeneously dense, which may obscure small masses.  There is a right upper inner posterior scar marker. There is some architectural distortion at the right posterior upper inner biopsy site, which appears stable. No suspicious spiculated mass or architectural distortion is seen. There are some benign-appearing calcifications, which appear stable. No suspicious forms are  seen. No significant adenopathy is seen.   IMPRESSION:   NO SUSPICIOUS INTERVAL CHANGES ARE SEEN.   RIGHT BIOPSY SITE APPEARS STABLE.   RECOMMENDATION: REPEAT MAMMOGRAM IN ONE YEAR.    01/25/2023 MRI of Brain  There are numerous periventricular and subcortical white matter T2 and FLAIR hyperintense foci. This is nonspecific the main considerations are advanced small vessel ischemic change or a demyelinating process.   Diffusion weighted images show no evidence of acute or recent infarct.   Postcontrast images show no suspicious enhancement.   Ventricles: Normal.   Major Intracranial Vessels: Normal flow voids.   Sinuses: There is extensive mucosal thickening throughout the sinuses.  Mastoids: Clear.         01/25/2023 Bone Density- Normal bone mineral density was measured on the current examination.       ASSESSMENT:  1. 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  A 12/22/2018 right breast core needle biopsy showed a benign sclerosing lesion only .    Osteopenia- A 12/22/2021 DEXA scan showed osteopenia.  Note that patient is also on SERM, Ospemifene 60 mg PO daily. Will continue with Ca + Vit D and add Alendronate  weekly.     2. 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 Right leg pain probably sciatica- previously under care of [facility]      4. History of obesity status post gastric bypass surgery        PLAN:  All relative external and internal  medical records were reviewed including available H&Ps, progress notes, procedure notes, imaging's, laboratories, and pathology       1. Discontinue  Letrozole  2.5 mg PO daily as therapy has completed, and could be contributing to her lower extremity weakness and pain.    2. Due for next Dexa Scan after 01/24/25   3. Repeat Mammogram due 11/2024 ordered today   4. Continue Alendronate  70 mg PO weekly (further refills, per PCP).  7. RTC 12 months  with repeat labs as above CBC, CMP CA 27-29    The patient was given the opportunity to ask questions, and  these were answered to their satisfaction. They are welcome to call with any questions or concerns at any point.      On the day of the encounter, I spent a total of 37 minutes on this patient encounter including review of historical information, examination, documentation and post-visit activities.       Lizzie Riis APRN, FNP-C 6/4/202510:57      CC:  Tresia Fruit [place]-Hurt, PA-C  [address] [place] [address]

## 2024-01-05 NOTE — Telephone Encounter (Addendum)
 Called and spoke with patient and let her know about lab results per Baker Bon. Let her know we would give her another call when the tumor marker results come back. Patient voiced understanding.     ----- Message from Baker Bon, FNP-C sent at 01/05/2024 11:22 AM EDT -----  Call patient and just let her know that the electrolytes and kidney function were normal, we will reach back out when the tumor maker results.  Lizzie Riis APRN, FNP-C 6/4/202511:22

## 2024-01-05 NOTE — Result Encounter Note (Signed)
 Call patient and just let her know that the electrolytes and kidney function were normal, we will reach back out when the tumor maker results.  Lizzie Riis APRN, FNP-C 6/4/202511:22

## 2024-01-06 DIAGNOSIS — M4802 Spinal stenosis, cervical region: Secondary | ICD-10-CM

## 2024-01-07 LAB — CA 27,29: CA 27.29: 12 U/mL (ref <38)

## 2024-01-07 NOTE — Result Encounter Note (Signed)
 Call patient and inform her that her CA 27-29 level is normal at 12.     Lizzie Riis APRN, FNP-C 6/6/202508:28

## 2024-01-10 NOTE — Telephone Encounter (Addendum)
 Called and spoke with patient and let her know that the breast cancer tumor marker lab result came back normal at 12, let her know that anything less than 38 is normal. Patient voiced understanding.     ----- Message from Baker Bon, FNP-C sent at 01/07/2024  8:28 AM EDT -----  Call patient and inform her that her CA 27-29 level is normal at 12.     Lizzie Riis APRN, FNP-C 6/6/202508:28

## 2024-01-12 ENCOUNTER — Encounter (INDEPENDENT_AMBULATORY_CARE_PROVIDER_SITE_OTHER): Payer: Self-pay | Admitting: NEUROLOGY

## 2024-01-22 ENCOUNTER — Other Ambulatory Visit (INDEPENDENT_AMBULATORY_CARE_PROVIDER_SITE_OTHER): Payer: Self-pay | Admitting: NEUROLOGY

## 2024-03-14 ENCOUNTER — Encounter (HOSPITAL_COMMUNITY): Payer: Self-pay

## 2024-03-14 ENCOUNTER — Other Ambulatory Visit (HOSPITAL_COMMUNITY): Payer: Self-pay | Admitting: PHYSICIAN ASSISTANT

## 2024-03-14 DIAGNOSIS — G43909 Migraine, unspecified, not intractable, without status migrainosus: Secondary | ICD-10-CM

## 2024-03-15 ENCOUNTER — Encounter (INDEPENDENT_AMBULATORY_CARE_PROVIDER_SITE_OTHER): Payer: Self-pay | Admitting: NEUROLOGY

## 2024-03-15 ENCOUNTER — Other Ambulatory Visit: Payer: Self-pay

## 2024-03-15 ENCOUNTER — Ambulatory Visit: Attending: NEUROLOGY

## 2024-03-15 ENCOUNTER — Ambulatory Visit (INDEPENDENT_AMBULATORY_CARE_PROVIDER_SITE_OTHER): Admitting: NEUROLOGY

## 2024-03-15 DIAGNOSIS — G379 Demyelinating disease of central nervous system, unspecified: Secondary | ICD-10-CM | POA: Insufficient documentation

## 2024-03-15 DIAGNOSIS — R270 Ataxia, unspecified: Secondary | ICD-10-CM

## 2024-03-15 DIAGNOSIS — R9082 White matter disease, unspecified: Secondary | ICD-10-CM

## 2024-03-15 MED ORDER — VERAPAMIL ER 360 MG 24 HR CAPSULE,EXTENDED RELEASE
360.0000 mg | ORAL_CAPSULE | Freq: Every day | ORAL | 0 refills | Status: AC
Start: 2024-03-15 — End: 2024-06-13

## 2024-03-15 MED ORDER — SUMATRIPTAN 20 MG/ACTUATION NASAL SPRAY
1.0000 | Freq: Once | NASAL | 2 refills | Status: AC | PRN
Start: 2024-03-15 — End: 2024-06-13

## 2024-03-15 NOTE — Procedures (Signed)
 NEUROLOGY, [facility]  [address]  [place] [address]    Procedure Note    Name: Jasmine Bates MRN:  [id number]   Date: 03/15/2024 DOB:  Dec 03, 1955 (67 y.o.)         [address] - NEEDLE ELECTROMYOGRAPHY; 1 EXTREMITY W/ OR W/O RELATED PARASPINAL AREAS (AMB ONLY)    Date/Time: 03/15/2024 12:43 PM    Performed by: Sherre Senior, DO  Authorized by: Sherre Senior, DO        ELECTROMYOGRAPHY REPORT    DATE OF SERVICE:  03/15/2024    PERFORMING PHYSICIAN:  CANDIE Arthea Sherre, D.O.      REFERRING PHYSICIAN:  CANDIE Arthea Sherre, D.O.    HISTORY:  She is a 68 year old woman who is referred for electrodiagnostic testing for ataxia with evidence of sensory deficits on exam. See initial visit note from November 02, 2023 for detailed history.     EXAMINATION:  See initial visit note from November 02, 2023 for detailed exam.    NERVE CONDUCTION STUDIES:  1.Normal right sural sensory nerve action potentials (SNAPs).  2.Normal right fibular-EDB and tibial-AH compound muscle action potentials (CMAPs).    ELECTROMYOGRAPHY:  Normal studies for the right vastus-lateralis, tibialis anterior, and medial gastrocnemius.    IMPRESSION:  This is a normal study.  Specifically, there is no electrophysiologic evidence for a large fiber polyneuropathy at this time.     CLINICAL NOTE:   The above findings were discussed with the patient. As there is no evidence of a large fiber polyneuropathy and cervical spine does not show advanced canal stenosis, I still don't have a clear reason for patient's falls unless they are somehow related to her prior brain imaging. We reached out to [facility], however, only MRIs that were sent were of her hip and lumbar spine so I don't have a comparison brain study. It has been over a year since her last MRI so I think it is appropriate to repeat this to see if white matter disease has advanced. I would also like to obtain vessel imaging as advanced atherosclerosis could also cause these changes. I will  check a MOG and AQP4 antibody given that MS workup was reportedly negative. She never scheduled physical therapy but is still open to this. She also complains of worsening headaches since cataract surgery. As she has long been controlled on verapamil , we will trial a gentle increase in her dose. Will also send in intranasal sumatriptan  as this has been helpful in the past.     S. Darlyn Sherre, DO  Assistant Professor of Neurology  [place]  [facility]

## 2024-03-16 ENCOUNTER — Telehealth (INDEPENDENT_AMBULATORY_CARE_PROVIDER_SITE_OTHER): Payer: Self-pay | Admitting: NEUROLOGY

## 2024-03-16 NOTE — Telephone Encounter (Signed)
 Prior shara has been submitted for MRI and MRA's it is Pending.

## 2024-03-22 LAB — MYELIN OLIG GLYCO (MOG-IGG1) FLUOR-ACT CELL SORTING (FACS) ASSAY, SERUM: MOG AB CBA, SERUM: NEGATIVE

## 2024-03-23 LAB — AQP4 AB,W/RFL TITER,SERUM: AQUAPORIN 4 AB, CBA, SERUM: NEGATIVE

## 2024-04-11 ENCOUNTER — Other Ambulatory Visit: Payer: Self-pay

## 2024-04-11 ENCOUNTER — Ambulatory Visit (HOSPITAL_COMMUNITY)
Admission: RE | Admit: 2024-04-11 | Discharge: 2024-04-11 | Disposition: A | Discharge status: Still In Hospital | Payer: Self-pay | Source: Ambulatory Visit | Attending: NEUROLOGY | Admitting: NEUROLOGY

## 2024-04-11 ENCOUNTER — Encounter (HOSPITAL_COMMUNITY): Payer: Self-pay

## 2024-04-11 DIAGNOSIS — R270 Ataxia, unspecified: Secondary | ICD-10-CM | POA: Insufficient documentation

## 2024-04-11 NOTE — PT Evaluation (Signed)
 [facility]  Outpatient Physical Therapy  [address]  [place], [address]  [phone]  (Fax) [phone]      Physical Therapy Mobility Evaluation    Date: 04/11/2024  Patient's Name: Jasmine Bates  Date of Birth: 1955-10-27  Physical Therapy Evaluation      Evaluating Physical Therapist: Jhonny Heman, PT, DPT, NCS  PT diagnosis/Reason for Referral: Frequent falls / ataxia   Next Scheduled Physician Appointment: June 13, 2024  Allergies/Contraindications: NA           SUBJECTIVE    HPI: Patient states her balance has been getting progressively worse over the past year and half without known cause. Reports recent EMG study was unremarkable. Reports she does feel weak -- stating that her legs can't support her. Also states she was evaluated for crystals in her inner ear a few years ago and was found to have an issue with this. States she is scheduled for MRI brain and spine next month.    Date of onset: Year and half ago    Fall History: Positive, 3 falls in past six months; mechanical in nature     Current Presentation: Independent with ADLs/iADLS; does require some assistance with showering occasionally due to dizziness     Equipment use: Denies     Past Medical History:   Past Medical History:   Diagnosis Date    BRCA1 gene mutation negative     BRCA2 gene mutation negative     Breast CA 2020    right lumpectomy/radiation/arimadex    COPD (chronic obstructive pulmonary disease)     Diabetes mellitus, type 2     Essential hypertension     Fatty liver     Hyperlipidemia     Hypothyroidism     Malignant neoplasm of breast 12/01/2021    Osteoarthritis     Piriformis syndrome     Sacroiliitis (CMS HCC)      Past Surgical History:   Past Surgical History:   Procedure Laterality Date    CESAREAN SECTION      HX BREAST LUMPECTOMY Right     10/2018 positive  pt had radiation    HX GASTRIC SLEEVE       Diagnostic imaging: Pending MRI brain and spine    Patient goals:  NORMALIZE FUNCTION and REDUCE FALLS    Pain location: Denies             Dizziness: Yes, rotational in nature with turns    Subjective Functional Reports:    Sitting: WFL    Standing: LIMITED    Walking: LIMITED    Lifting: LIMITED    Self report measures:    ACTIVITIES SPECIFIC BALANCE CONFIDENCE (ABC) SCALE    For each of the following activities, please indicate your level of self-  confidence by choosing a corresponding number from the following rating scale :    0% 10% 20% 30% 40% 50% 60% 70% 80% 90% 100%    Where 0% = No confidence   And 100% = completely confident    How confident are you that you will NOT lose your balance or become unsteady if    you.SABRA    1 . walk around the house (20 ) %    2 .walk up or down stairs (10) %    3 .bend over and pick up a slipper from the front of a closet floor( 10) %    4 .reach for a small can  off a shelf at eye level ( 60) %    5 .stand on your tip toes and reach for something above your head( 40) %    6 .stand on a chair and reach for something (0 ) %    7 .sweep the floor (100 ) %    8 .walk outside the house to a car parked in the driveway ( 50) %    9 .get into or out of a car ( 60) %    10 .walk across the parking lot to the mall (60 ) %    11 .walk up or down a ramp (60 ) %    12 .walk in a crowded mall where people are rapidly walking past you ( 60) %    13 .are bumped into by people as you walk through the mall ( 40) %    14 .step onto or off an escalator while you are holding onto a railing (20 ) %    15 .step onto or off an escalator while holding onto parcels such that you  cannot hold onto the railing (0 ) %    16 .walk outside on icy/slippery sidewalks (20 ) %    TOTAL =  610 / 1600 = 38 %    INTERPRETATION: < 67% indicates a risk for falling    80%= high level of physical functioning  50-80%= moderate level of physical functioning  < 50%= low level of physical functioning  Myers AM (1998)  LaJoie (2004)    Perri DANIELS and [place] AM. The activities-specific  balance confidence (ABC) scale.  J Gerontol Med Sci. 1995; 50 (1): [id number]      Patient-Specific Functional Score:    Problem Score   1. Ambulating without stagger 2   2. Using step stool in kitchen  0   TOTAL  1.5   Total score = sum of the activity scores/number of activities    Minimal detectable change (90% CI) for avg score = 2 points    Minimal detectable change (90% CI) for single activity score = 3 points            OBJECTIVE    -Observation: Pleasant and agreeable to PT evaluation      -Mental Status and Behavior: Appropriately oriented, able to answer questions, and participate in  conversation    -Communication: Comprehension/Expression: No concerns or reported changes from baseline    Quick Neurological Screen:    -Smooth pursuits: normal, symmetrical     -Rapid Alternating Movement: normal, symmetrical     -Heel to Shin: normal, symmetrical     -Finger to Nose:  normal, symmetrical     -Strength: Tested sitting in standard chair (Manual Muscle Testing per [employer] Muscle Grading system)    Upper extremity Right Left   C5 Shoulder Abduction  5 5   C5-6 Elbow Flexion 5 5   C7 Elbow Extension 5 5   T1 Digit Adduction 5 5       Lower extremity Right Left   L1-2 Hip Flexion  4 4   L5 Hip Extension 3 3   L5-S2 Hip Abduction 3 3   L3 Knee Extension 4 4   S2 Knee Flexion  4 5   L4 Ankle Dorsiflexion 5 5   S1 Ankle Plantarflexion  5 5       Reflexes Right Left   Biceps (C5,C6) 3+ 3+   Triceps (C6, C7, C8) 3+ 3+   Patellar ( L2-L4)  3+ 3+   Achilles (S1,S2) 2+ 2+   0 = absent    1+ = decreased    2+ = normal    3+ = increased but normal    4+ = markedly hyperactive, with clonus       -Pathologic Reflexes:    Hofmman's: Negative BUE    Clonus: Negative BLE    Mobility sit to stand: USES UE PUSH OFF and LABORED  Mobility stand to sit: UNCONTROLLED DESCENT    Gait: Deviations observed-    (  ) No significant deviations observed    (  ) Trunk lateral lean    (  ) Forward trunk flexion    (  ) Hip hiking    (  ) Hip  circumduction    (  ) Scissoring    (  ) Trendelenburg:   R or  L    (  ) Knee hyperextension: R or L    (  ) Foot drop: R or L    (  ) Ataxic gait pattern    (  ) Antalgic gait pattern    (  ) Festinating    (  ) Shuffling    (  ) Decreased gait speed    (  ) Widened base of support    (  ) OTHER:    Static standing balance: UNSTEADY (EXHIBITS POSTURAL SWAY)  Challenged standing balance:FALLS  Ambulating balance:UNSTEADY WITH DIRECTION CHANGE    Functional Outcome Measures:    Timed Up and Go Test (TUG): 12.5s from standard chair without AD     >13.5 seconds = falls for community dwelling adults    60-69 year olds (7.1-9.0 seconds)    70-79 year olds (8.2-10.2 seconds)    80-99 year olds (10.0-12.7 seconds)    Five Times Sit to Stand Test (5TSTS): 19.27s from standard chair without UE support for push-off    >12 seconds = falls for community dwelling adults    60-69 year olds 11.4 seconds    70-79 year olds 12.6 seconds    80-89 year olds 14.8 seconds    POSITIONAL TESTING:    Dix-Hallpike:       (R) latent onset strong up-beating & right torsional nystagmus,            lasting 10-15 seconds - with subjective complaints of vertigo       (L) Not tested     Roll test: (L) Not tested  (R) Not tested              >TREATMENT PERFORMED       1. Performed CRM for treatment of right posterior canalithiasis.       2. Advised to avoid cervical flexion and extension x 3 hrs, avoid sleeping on       right side x 3 nights, limit caffeine intake, and maintain adequate hydration.       3. Follow-up in in 1 week to re-check for BPPV.             Treatment provided:REVIEW OF POC AND GOALS WITH PATIENT, ALL QUESTIONS ANSWERED, PATIENT EDUCATION, and CANALITH REPOSITIONING MANEUVER       EXERCISE/ACTIVITY NAME REPETITIONS RESISTANCE COMPLETED THIS DOS   CRM for right posterior canalithiasis (Epley)   1    Yes             ASSESSMENT    Impression: Patient is a 68 year old female referred for recurrent falls and  gait ataxia. Patient's  strength is within functional limits, however, she does have mild strength deficits proximally on both lower extremities. Quick neurological screen performed with no evidence of upper motor neuron signs. Administered functional outcome measures to assess BLE strength (5TSTS) and dynamic balance (TUG) with patient scoring in fall risk category on 5TSTS but within normal limits on TUG. Patient with right posterior canalithiasis (mild today) in Dix-Hallpike which is likely contributing to her overall imbalance. Patient will benefit from PT intervention to work on increasing functional strength and endurance as well as dynamic balance to improve ADL capacity and reduce risk of falls.  She will also benefit from canalith repositioning maneuver as needed.       Rehab potential: GOOD      Short-Term Goals: 3 Weeks   - Patient will be independent with progressive HEP to maximize gains from PT.   - Improve score on 5TSTS to at least 12s   indicating improved BLE strength and progress toward reducing fall risk.   - Patient will demonstrate improved functional ability via improved Patient Specific Functional Score to at least 3.5.     Long-Term Goals: 6 Weeks   - Patient will demonstrate improved bilateral LE strength of at least 4+/5 to maximize independence with self-care.     -Demonstrate ability to roll in bed 3/3X without symptoms of vertigo or nystagmus for safe bed mobility.   -Demonstrate ability to ambulate with head movements in pitch and yaw without change in speed or staggering outside 10" path to enable safe scanning of   environment for obstacles or pivot turn R/L without LOB.   -Patient will improve score on ABC to at least 67% indicating improved subjective balance confidence and reduced risk of falls.    PLAN  Patient will attend 2 times per week x 6 weeks. Therapy may include, but is not limited to THERAPEUTIC EXERCISES, MYOFASCIAL/JOINT MOBILIZATION, POSTURE/BODY MECHANICS, ERGONOMIC TRAINING, TRANSFER/GAIT  TRAINING, HOME INSTRUCTIONS, HEAT/COLD, ULTRASOUND, ELECTRICAL STIMULATION, KINESIOTAPE, NEURO RE-EDUCATION, DRY NEEDLING, THERAPEUTIC ACTIVITY, and CRM    Plan for next visit: Hold BPPV treatment for PT sessions only. Initiate dynamic balance and BLE strengthening. Avoid recumbent positions that may trigger BPPV.     Evaluation complexity:   Personal factors impacting POC: PRE-EXISTING FUNCTIONAL LIMITATIONS   Co-morbidities impacting POC: PRE-EXISTING NEUROLOGICAL DEFICITS  Complexity of physical exam: INCLUDING MUSCULOSKELETAL SYSTEM (POSTURE, ROM, STRENGTH, HEIGHT/WEIGHT), INCLUDING NEUROMUSCULAR EXAM (BALANCE, GAIT, LOCOMOTION, MOBILITY), INCLUDING ACTIVITY/MOBILITY RESTRICTIONS, and REFERRAL IS FOR A CHRONIC PROBLEM   Clinical Presentation: EVOLVING WITH UNSTABLE AND UNPREDICTABLE CHARACTERISTICS   Evaluation Complexity: HIGH-HISTORY 3+, EXAMINATION 4+, PRESENTATION EVOLVING/UNSTABLE    Total Session Time 60 and Untimed code minutes 60       Intervention minutes: EVALUATION 45 minutes and CRM 15    Lorie Melichar, PT, DPT, NCS  04/11/2024 14:55

## 2024-04-13 ENCOUNTER — Other Ambulatory Visit: Payer: Self-pay

## 2024-04-13 ENCOUNTER — Inpatient Hospital Stay (HOSPITAL_COMMUNITY)
Admission: RE | Admit: 2024-04-13 | Discharge: 2024-04-13 | Disposition: A | Discharge status: Still In Hospital | Payer: Self-pay | Source: Ambulatory Visit

## 2024-04-13 NOTE — PT Treatment (Signed)
 [facility]  Outpatient Physical Therapy  [address]  [place], [address]  [phone]  (Fax) [phone]    Physical Therapy Treatment Note    Date: 04/13/2024  Patient's Name: Jasmine Bates  Date of Birth: Dec 29, 1955  Physical Therapy Visit    Visit #/POC: 2  Authorization: ?  POC Signed?: ?  POC Ends: ?  Order Ends: ?  Next Progress Note Due: ?    Evaluating Physical Therapist: Jhonny Heman, PT, DPT, NCS  PT diagnosis/Reason for Referral: Frequent falls / ataxia   Next Scheduled Physician Appointment: June 13, 2024  Allergies/Contraindications: NA (avoid supine positions)      Subjective: No complaints noted upon arrival nor reports of falling/LOB.    Objective: Warm up on Nustep followed by exercises  per flowsheet for strengthening and balance. Patient was most challenged with tandem stance,increased weakness on L ankle vs R.    Measured ROM: not assessed today, 9/11  EXERCISE/ACTIVITY NAME REPETITIONS RESISTANCE COMPLETED THIS DOS   Nustep 4 Y    Step ups   Toe taps 10 ea  15 ea  y   Standing hip 3-way 10 ea green y   STS 10     Tandem stance  Tandem gait 5 x  4 passes   Foam beam Y  y   LAQ 2 x 10 3lbs                        DISCONTINUED ACTIVITIES                            Access Code: [email or web address]  URL: [email or web address]  Date: 04/13/2024  Prepared by: Geni Search    Exercises  - Seated Long Arc Quad  - 1-2 x daily - 7 x weekly - 2-3 sets - 10 reps  - Standing 3-Way Leg Reach with Resistance at Ankles and Counter Support  - 1-2 x daily - 7 x weekly - 2-3 sets - 10 reps  - Sit to Stand  - 1-2 x daily - 7 x weekly - 2-3 sets - 10 reps    Assessment: Tol session well. Issued handout and green band for HEP.    Short-Term Goals: 3 Weeks   - Patient will be independent with progressive HEP to maximize gains from PT.   - Improve score on 5TSTS to at least 12s   indicating improved BLE strength and progress toward reducing fall risk.   - Patient  will demonstrate improved functional ability via improved Patient Specific Functional Score to at least 3.5.     Long-Term Goals: 6 Weeks   - Patient will demonstrate improved bilateral LE strength of at least 4+/5 to maximize independence with self-care.     -Demonstrate ability to roll in bed 3/3X without symptoms of vertigo or nystagmus for safe bed mobility.   -Demonstrate ability to ambulate with head movements in pitch and yaw without change in speed or staggering outside 10" path to enable safe scanning of   environment for obstacles or pivot turn R/L without LOB.   -Patient will improve score on ABC to at least 67% indicating improved subjective balance confidence and reduced risk of falls.    Plan: Cont per PT POC.    Total Session Time 40, Timed code minutes 40, and Untimed code minutes 0  THERAPEUTIC EXERCISE 40 minutes      Geni Search,  PTA  04/13/2024, 11:45

## 2024-04-17 ENCOUNTER — Ambulatory Visit (HOSPITAL_COMMUNITY)
Admission: RE | Admit: 2024-04-17 | Discharge: 2024-04-17 | Disposition: A | Discharge status: Still In Hospital | Payer: Self-pay | Source: Ambulatory Visit

## 2024-04-17 ENCOUNTER — Other Ambulatory Visit: Payer: Self-pay

## 2024-04-17 NOTE — PT Treatment (Signed)
 [facility]  Outpatient Physical Therapy  [address]  [place], [address]  [phone]  (Fax) [phone]    Physical Therapy Treatment Note    Date: 04/17/2024  Patient's Name: Jasmine Bates  Date of Birth: [birth year]  Physical Therapy Visit    Visit #/POC: 3 of up to 12 planned   Authorization: 3 of 12 authorized (07/10/24)  POC Signed?: yes  POC Ends: 05/23/24  Order Ends: open order  Next Progress Note Due: visit 8 or by 05/11/24    Evaluating Physical Therapist: Jhonny Heman, PT, DPT, NCS  PT diagnosis/Reason for Referral: Frequent falls / ataxia   Next Scheduled Physician Appointment: June 13, 2024  Allergies/Contraindications: NA (avoid supine positions)      Subjective: Patient reports she is doing well today.  No pain or dizziness present.  No new falls to report. She reports no issues with home exercises.     Objective: Warm up on Nustep followed by exercises  per flowsheet for strengthening and balance.     Measured ROM: not assessed today, 9/11  EXERCISE/ACTIVITY NAME REPETITIONS RESISTANCE COMPLETED THIS DOS   Nustep 8 min 4 Y    Step ups   Toe taps 10 ea  15 ea 4  4 Y  Y    Standing hip 3-way 10 ea green y   STS 10 Holding 4.4# mediball  Y    Tandem stance  Tandem gait 20-30 sec x 3 trials each   4 passes   Foam beam Y  y   Airex NBOS EO/EC Multiple trials   Y    LAQ 2 x 12 3lbs Y                        DISCONTINUED ACTIVITIES                            Access Code: [email or web address]  URL: [email or web address]  Date: 04/13/2024  Prepared by: Geni Search    Exercises  - Seated Long Arc Quad  - 1-2 x daily - 7 x weekly - 2-3 sets - 10 reps  - Standing 3-Way Leg Reach with Resistance at Ankles and Counter Support  - 1-2 x daily - 7 x weekly - 2-3 sets - 10 reps  - Sit to Stand  - 1-2 x daily - 7 x weekly - 2-3 sets - 10 reps    Assessment: Patient demonstrates greater difficulty maintaining balance with left foot in posterior position during tandem  stance vs right in posterior position. Able to complete balance beam tandem gait without UE use but is unsteady. Tolerated session well.     Short-Term Goals: 3 Weeks   - Patient will be independent with progressive HEP to maximize gains from PT.   - Improve score on 5TSTS to at least 12s   indicating improved BLE strength and progress toward reducing fall risk.   - Patient will demonstrate improved functional ability via improved Patient Specific Functional Score to at least 3.5.     Long-Term Goals: 6 Weeks   - Patient will demonstrate improved bilateral LE strength of at least 4+/5 to maximize independence with self-care.     -Demonstrate ability to roll in bed 3/3X without symptoms of vertigo or nystagmus for safe bed mobility.   -Demonstrate ability to ambulate with head movements in pitch and yaw without change in speed or staggering outside 10"  path to enable safe scanning of   environment for obstacles or pivot turn R/L without LOB.   -Patient will improve score on ABC to at least 67% indicating improved subjective balance confidence and reduced risk of falls.    Plan: Cont per PT POC.    Total Session Time 38, Timed code minutes 38, and Untimed code minutes 0  THERAPEUTIC EXERCISE 38 minutes      [place], PTA  04/17/2024 11:04

## 2024-04-20 ENCOUNTER — Ambulatory Visit (HOSPITAL_COMMUNITY): Payer: Self-pay

## 2024-04-24 ENCOUNTER — Ambulatory Visit
Admission: RE | Admit: 2024-04-24 | Discharge: 2024-04-24 | Disposition: A | Discharge status: Home / Self Care | Source: Ambulatory Visit | Attending: PHYSICIAN ASSISTANT

## 2024-04-24 ENCOUNTER — Other Ambulatory Visit: Payer: Self-pay

## 2024-04-24 DIAGNOSIS — G43909 Migraine, unspecified, not intractable, without status migrainosus: Secondary | ICD-10-CM | POA: Insufficient documentation

## 2024-04-25 ENCOUNTER — Ambulatory Visit (HOSPITAL_COMMUNITY)
Admission: RE | Admit: 2024-04-25 | Discharge: 2024-04-25 | Disposition: A | Discharge status: Still In Hospital | Payer: Self-pay | Source: Ambulatory Visit

## 2024-04-25 NOTE — PT Treatment (Signed)
 [facility]  Outpatient Physical Therapy  [address]  [place], [address]  [phone]  (Fax) [phone]    Physical Therapy Treatment Note    Date: 04/25/2024  Patient's Name: Jasmine Bates  Date of Birth: 1956-01-29  Physical Therapy Visit    Visit #/POC: 4 of up to 12 planned   Authorization: 4 of 12 authorized (07/10/24)  POC Signed?: yes  POC Ends: 05/23/24  Order Ends: open order  Next Progress Note Due: visit 8 or by 05/11/24     Evaluating Physical Therapist: Jhonny Heman, PT, DPT, NCS  PT diagnosis/Reason for Referral: Frequent falls / ataxia   Next Scheduled Physician Appointment: June 13, 2024  Allergies/Contraindications: NA (avoid supine positions)        Subjective: Patient reports she is doing well today.  No pain or dizziness present.  No new falls to report. She reports no issues with home exercises.      Objective: Warm up on Nustep followed by exercises  per flowsheet for strengthening and balance.     5 STS: 9.60    MMT LE: 4+/5 bil,     Patient-Specific Functional Score:     Problem Score 9/23   1. Ambulating without stagger 2 3   2. Using step stool in kitchen  0 3   TOTAL  1.5 3   Total score = sum of the activity scores/number of activities    Minimal detectable change (90% CI) for avg score = 2 points    Minimal detectable change (90% CI) for single activity score = 3 points             Measured ROM: not assessed today, 9/11  EXERCISE/ACTIVITY NAME REPETITIONS RESISTANCE COMPLETED THIS DOS   Nustep 8 min 4 Y    Step ups   Toe taps 10 ea  15 ea 4  4 Y  Y    Standing hip 3-way 10 ea green N-HEP   STS 10 Holding 4.4# mediball  N-HEP    Tandem stance  Tandem gait 20-30 sec x 3 trials each   4 passes    Foam beam Y  y   Airex NBOS EO/EC Multiple trials    y    LAQ 2 x 12 3lbs n     hamstring curl  Knee extension 2 x 10  2 x 10 35 lbs  35 lbs Y  y   Hip abd machine 2 x 10 30 lbs y                DISCONTINUED ACTIVITIES                                              Access Code: [email or web address]  URL: [email or web address]  Date: 04/13/2024  Prepared by: Geni Search     Exercises  - Seated Long Arc Quad  - 1-2 x daily - 7 x weekly - 2-3 sets - 10 reps  - Standing 3-Way Leg Reach with Resistance at Ankles and Counter Support  - 1-2 x daily - 7 x weekly - 2-3 sets - 10 reps  - Sit to Stand  - 1-2 x daily - 7 x weekly - 2-3 sets - 10 reps     Assessment: Met 2 of 3 STG and first LTG. Patient states she doe still get dizzy when  she first stands up.     Short-Term Goals: 3 Weeks   - Patient will be independent with progressive HEP to maximize gains from PT. MET 9/23  - Improve score on 5TSTS to at least 12s   indicating improved BLE strength and progress toward reducing fall risk. MET 9/23  - Patient will demonstrate improved functional ability via improved Patient Specific Functional Score to at least 3.5. Progressing 9/23    Long-Term Goals: 6 Weeks   - Patient will demonstrate improved bilateral LE strength of at least 4+/5 to maximize independence with self-care. MET 9/23    -Demonstrate ability to roll in bed 3/3X without symptoms of vertigo or nystagmus for safe bed mobility.   -Demonstrate ability to ambulate with head movements in pitch and yaw without change in speed or staggering outside 10" path to enable safe scanning of   environment for obstacles or pivot turn R/L without LOB.   -Patient will improve score on ABC to at least 67% indicating improved subjective balance confidence and reduced risk of falls.     Plan: Cont per PT POC.    Total Session Time 40, Timed code minutes 40, and Untimed code minutes 0  THERAPEUTIC EXERCISE 40 minutes      Geni Search, PTA  04/25/2024, 09:26

## 2024-04-27 ENCOUNTER — Other Ambulatory Visit: Payer: Self-pay

## 2024-04-27 ENCOUNTER — Ambulatory Visit
Admission: RE | Admit: 2024-04-27 | Discharge: 2024-04-27 | Disposition: A | Discharge status: Still In Hospital | Payer: Self-pay | Source: Ambulatory Visit

## 2024-04-27 NOTE — PT Treatment (Signed)
 [facility]  Outpatient Physical Therapy  [address]  [place], [address]  [phone]  (Fax) [phone]    Physical Therapy Treatment Note    Date: 04/27/2024  Patient's Name: Jasmine Bates  Date of Birth: 1955/09/03  Physical Therapy Visit    Visit #/POC: 5 of up to 12 planned   Authorization: 5 of 12 authorized (07/10/24)  POC Signed?: yes  POC Ends: 05/23/24  Order Ends: open order  Next Progress Note Due: visit 8 or by 05/11/24     Evaluating Physical Therapist: Jhonny Heman, PT, DPT, NCS  PT diagnosis/Reason for Referral: Frequent falls / ataxia   Next Scheduled Physician Appointment: June 13, 2024  Allergies/Contraindications: NA (avoid supine positions)     Subjective: Patient states she is doing well. Has only been slightly dizzy, mostly with transitioning sit to stand.      Objective: Treatment as below:      EXERCISE/ACTIVITY NAME REPETITIONS RESISTANCE COMPLETED THIS DOS   Nustep 8 min 5 Y    Step ups   Toe taps 10 ea  15 ea 4  4 N  N    Standing hip 3-way 10 ea green Y-HEP   Mini-Squats on Airex Foam  15  Y   Lateral Step overs 15 ea LE 6 box Y   STS 10 Holding 4.4# mediball  N-HEP    Tandem stance  Tandem gait 20-30 sec x 3 trials each   4 passes    Foam beam N  N   Airex NBOS EO/EC Multiple trials    N    LAQ 2 x 12 3lbs n     hamstring curl  Knee extension 2 x 10  2 x 10 35 lbs  35 lbs N  N   Hip abd machine 2 x 10 30 lbs N   CRM for right posterior canalithiasis (Epley)     2    Yes      POSITIONAL TESTING:    Dix-Hallpike:       (R) latent onset MILD up-beating & right torsional nystagmus,            lasting 10-15 seconds - with subjective complaints of vertigo       (L) Not tested     Roll test: (L) Not tested  (R) Not tested       >TREATMENT PERFORMED       1. Performed CRM x 2 for treatment of right posterior canalithiasis.       2. Advised to avoid cervical flexion and extension x 3 hrs, avoid sleeping on   right side x 3 nights, limit  caffeine intake, and maintain adequate hydration.       3. Follow-up in in 1 week to re-check for BPPV.     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 On re-check, patient clear, free of nystagmus and symptoms of vertigo. Instructed patient's spouse on how he may perform Epley maneuver on her at home, per patient request. Will provide handout of visual steps next session.      Short-Term Goals: 3 Weeks   - Patient will be independent with progressive HEP to maximize gains from PT. MET 9/23  - Improve score on 5TSTS to at least 12s   indicating improved BLE strength and progress toward reducing fall risk. MET 9/23  - Patient will demonstrate improved functional ability via improved Patient Specific Functional Score to at least 3.5. Progressing 9/23    Long-Term Goals: 6 Weeks   - Patient will demonstrate improved bilateral LE strength of at least 4+/5 to maximize independence with self-care. MET 9/23    -Demonstrate ability to roll in bed 3/3X without symptoms of vertigo or nystagmus for safe bed mobility.   -Demonstrate ability to ambulate with head movements in pitch and yaw without change in speed or staggering outside 10" path to enable safe scanning of   environment for obstacles or pivot turn R/L without LOB.   -Patient will improve score on ABC to at least 67% indicating improved subjective balance confidence and reduced risk of falls.         Plan:  Hold BPPV treatment. Continue dynamic balance and strength training at  moderate to high intensity.     Total Session Time 45, Timed code minutes 30, and Untimed code minutes 15  THERAPEUTIC EXERCISE 30 minutes and CRM 15 minutes      Terran Hollenkamp, PT  04/27/2024 12:11

## 2024-05-02 ENCOUNTER — Ambulatory Visit
Admission: RE | Admit: 2024-05-02 | Discharge: 2024-05-02 | Disposition: A | Discharge status: Still In Hospital | Payer: Self-pay | Source: Ambulatory Visit | Attending: NEUROLOGY | Admitting: NEUROLOGY

## 2024-05-02 ENCOUNTER — Other Ambulatory Visit: Payer: Self-pay

## 2024-05-02 NOTE — PT Treatment (Signed)
 [facility]  Outpatient Physical Therapy  [address]  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        Objective: Treatment as below: Assessed gait with gaze directions. Did well with no disturbances noted. Patient does observed to have less DF on LLE during toe taps and high marching exercises. Added seated t-band DF exercises to improve strength deficits that remain.     EXERCISE/ACTIVITY NAME REPETITIONS RESISTANCE COMPLETED THIS DOS   Nustep 8 min 5 Y    Step ups   Toe taps 10 ea  15 ea 4  4 N  N    Standing hip 3-way 10 ea green n-HEP   Mini-Squats on Airex Foam  15   Y   Lateral Step overs 15 ea LE 6 box Y   High marching in hallway 1 length  y   STS 10 Holding 4.4# mediball  N-HEP    Tandem stance  Tandem gait 20-30 sec x 3 trials each   4 passes    Foam beam N  N   Airex NBOS EO/EC Multiple trials    N    LAQ 2 x 12 3lbs n     hamstring curl  Knee extension 2 x 10  2 x 10 35 lbs  35 lbs y  y   Hip abd machine 2 x 10 30 lbs y   CRM for right posterior canalithiasis (Epley)     2    n           DISCONTINUED ACTIVITIES                                             Access Code: [email or web address]  URL: [email or web address]  Date: 04/13/2024  Prepared by: Geni Search     Exercises  - Seated Long Arc Quad   - 1-2 x daily - 7 x weekly - 2-3 sets - 10 reps  - Standing 3-Way Leg Reach with Resistance at Ankles and Counter Support  - 1-2 x daily - 7 x weekly - 2-3 sets - 10 reps  - Sit to Stand  - 1-2 x daily - 7 x weekly - 2-3 sets - 10 reps        Assessment:  Good tolerance of Rx. Achieved LTG #3.        Short-Term Goals: 3 Weeks   - Patient will be independent with progressive HEP to maximize gains from PT. MET 9/23  - Improve score on 5TSTS to at least 12s   indicating improved BLE strength and progress toward reducing fall risk. MET 9/23  - Patient will demonstrate  improved functional ability via improved Patient Specific Functional Score to at least 3.5. Progressing 9/23    Long-Term Goals: 6 Weeks   - Patient will demonstrate improved bilateral LE strength of at least 4+/5 to maximize independence with self-care. MET 9/23    -Demonstrate ability to roll in bed 3/3X without symptoms of vertigo or nystagmus for safe bed mobility.   -Demonstrate ability to ambulate with head movements in pitch and yaw without change in speed or staggering outside 10" path to enable safe scanning of environment for obstacles or pivot turn R/L without LOB. MET 9/30  -Patient will improve score on ABC to at least 67% indicating improved subjective balance confidence and reduced risk of falls.           Plan:  . Continue dynamic balance and strength training at moderate to high intensity.     Total Session Time 35, Timed code minutes 35, and Untimed code minutes 0  THERAPEUTIC EXERCISE 35 minutes      Geni Search, PTA  05/02/2024, 09:35

## 2024-05-03 ENCOUNTER — Ambulatory Visit
Admission: RE | Admit: 2024-05-03 | Discharge: 2024-05-03 | Disposition: A | Discharge status: Home / Self Care | Payer: Self-pay | Source: Ambulatory Visit | Attending: NEUROLOGY | Admitting: NEUROLOGY

## 2024-05-03 ENCOUNTER — Ambulatory Visit (HOSPITAL_COMMUNITY)
Admission: RE | Admit: 2024-05-03 | Discharge: 2024-05-03 | Disposition: A | Payer: Self-pay | Source: Ambulatory Visit | Attending: NEUROLOGY

## 2024-05-03 DIAGNOSIS — R9082 White matter disease, unspecified: Secondary | ICD-10-CM

## 2024-05-03 DIAGNOSIS — G379 Demyelinating disease of central nervous system, unspecified: Secondary | ICD-10-CM

## 2024-05-03 DIAGNOSIS — Z0489 Encounter for examination and observation for other specified reasons: Secondary | ICD-10-CM

## 2024-05-03 MED ORDER — GADOBUTROL 10 MMOL/10 ML (1 MMOL/ML) INTRAVENOUS SOLUTION
10.0000 mL | INTRAVENOUS | Status: AC
Start: 2024-05-03 — End: 2024-05-03
  Administered 2024-05-03: 6 mL via INTRAVENOUS

## 2024-05-04 ENCOUNTER — Other Ambulatory Visit: Payer: Self-pay

## 2024-05-04 ENCOUNTER — Ambulatory Visit
Admission: RE | Admit: 2024-05-04 | Discharge: 2024-05-04 | Disposition: A | Discharge status: Still In Hospital | Payer: Self-pay | Source: Ambulatory Visit | Attending: NEUROLOGY

## 2024-05-04 DIAGNOSIS — R270 Ataxia, unspecified: Secondary | ICD-10-CM | POA: Insufficient documentation

## 2024-05-04 NOTE — Progress Notes (Signed)
 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 Remains with hesitation when going out in the community. States she is having vision issues with the right eye. States she followed up with optometrist but nothing can be done for several months.       Self report measures:     ACTIVITIES SPECIFIC BALANCE CONFIDENCE (ABC) SCALE    For each of the following activities, please indicate your level of self-  confidence by choosing a corresponding number from the following rating scale :    0% 10% 20% 30% 40% 50% 60% 70% 80% 90% 100%     Where 0% = No confidence   And 100% = completely confident    How confident are you that you will NOT lose your balance or become unsteady if    you.    1  walk around the house (80 ) %    2 walk up or down stairs (60) %    3 bend over and pick up a slipper from the front of a closet floor( 50) %    4 reach for a small can off a shelf at eye level ( 90) %    5 stand on your tip toes and reach for something above your head( 70) %    6 stand on a chair and reach for something (20 ) %    7 sweep the floor (90 ) %    8 walk outside the house to a car parked in the driveway ( 80) %    9 get into or out of a car ( 90) %    10 walk across the parking lot to the mall  (80 ) %    11 walk up or down a ramp (70 ) %    12 walk in a crowded mall where people are rapidly walking past you ( 70) %    13 are bumped into by people as you walk through the mall ( 60) %    14 step onto or off an escalator while you are holding onto a railing (60 ) %    15 step onto or off an escalator while holding onto parcels such that you  cannot hold onto the railing (40 ) %    16 walk outside on icy/slippery sidewalks (20 ) %     TOTAL =  1030 / 1600 = 64 %     INTERPRETATION: < 67% indicates a risk for falling    80%= high level of physical functioning  50-80%= moderate level of physical functioning  < 50%= low level of physical functioning  Myers AM (1998)  LaJoie (2004)    Perri DANIELS and Billy AM. The activities-specific balance confidence (ABC)  scale.  J Gerontol Med Sci. 1995; 50 (1): M28-34     Patient-Specific Functional Score:     Problem Score, evaluation Score, progress check    1. Ambulating without stagger 2 5   2. Using step stool in kitchen  0 9   TOTAL  1.5    Total score = sum of the activity scores/number of activities    Minimal detectable change (90% CI) for avg score = 2 points    Minimal detectable change (90% CI) for single activity score = 3 points           Objective: Re-assessment and treatment as below:     -Strength: Tested sitting in standard chair (Manual Muscle Testing per Kendall Muscle Grading system)    Lower extremity Right Left   L1-2 Hip Flexion  4 4   L5 Hip Extension 3+ 4   L5-S2 Hip Abduction 3+ 4   L3 Knee Extension 4 5   S2 Knee Flexion  4 5   L4 Ankle Dorsiflexion 5 5   S1 Ankle Plantarflexion  5 5     Functional Outcome Measures:     Timed Up and Go Test (TUG): 9.3s from standard chair without AD    [12.5s from standard chair without AD at eval]    >13.5 seconds = falls for community dwelling adults    60-69 year olds (7.1-9.0 seconds)    70-79 year olds (8.2-10.2 seconds)    80-99 year olds (10.0-12.7 seconds)     Five Times Sit to Stand Test (5TSTS):  11.2s from standard chair with unilateral UE support    [19.27s from standard chair without UE support for push-off at eval]    >12 seconds = falls for community dwelling adults    60-69 year olds 11.4 seconds    70-79 year olds 12.6 seconds    80-89 year olds 14.8 seconds       EXERCISE/ACTIVITY NAME REPETITIONS RESISTANCE COMPLETED THIS DOS   Nustep 8 min 5 Y    Standing reciprocal march 45s x 2  Y   Wide BOS body weight squats 10  Y   Side stepping with mini-squat 50' x 2  Y   Step ups   Toe taps 10 ea  15 ea 4  4 N  N    Standing hip 3-way 10 ea green n-HEP   Mini-Squats on Airex Foam  15   n   Lateral Step overs 15 ea LE 6 box N   High marching in hallway 1 length   N   STS 10 Holding 4.4# mediball  N-HEP    Tandem stance  Tandem gait 20-30 sec x 3 trials each   4 passes    Foam beam N  N   Airex NBOS EO/EC Multiple trials    N    LAQ 2 x 12 3lbs n     hamstring curl  Knee extension 2 x 10  2 x 10 35 lbs  35 lbs N  N   Hip abd machine 2 x 10 30 lbs N   CRM for right posterior canalithiasis (Epley)     2    n            DISCONTINUED ACTIVITIES  Access Code: [email or web address]  URL: [email or web address]  Date: 04/13/2024  Prepared by: Geni Search     Exercises  - Seated Long Arc Quad  - 1-2 x daily - 7 x weekly - 2-3 sets - 10 reps  - Standing 3-Way Leg Reach with Resistance at Ankles and Counter Support  - 1-2 x daily - 7 x weekly - 2-3 sets - 10 reps  - Sit to Stand  - 1-2 x daily - 7 x weekly - 2-3 sets - 10 reps        Assessment:  Progress demonstrated toward both short and long term goals. Patient with improvements on functional outcome measures administered and is no longer in fall risk category. Significant progress achieved in subjective balance confidence (via ABC scale), however, patient does remain below cut-off indicative of increased fall risk. 5 of 7 established goals met. Will continue to benefit from PT intervention to address remaining deficits.          Short-Term Goals: 3 Weeks   - Patient will be independent with progressive HEP to maximize gains from PT. NOT MET 05/04/2024  - Improve score on 5TSTS to at least 12s   indicating improved BLE strength and progress toward reducing fall risk. MET 05/04/2024  - Patient will demonstrate improved functional ability via improved Patient Specific Functional Score to at least 3.5. NOT MET 05/04/2024    Long-Term Goals: 6 Weeks   - Patient will demonstrate improved bilateral LE strength of at least 4+/5 to maximize independence with self-care. MET 05/04/2024    -Demonstrate ability to roll in bed 3/3X without symptoms of vertigo or nystagmus for safe bed mobility. MET 05/04/2024  -Demonstrate ability to ambulate with head movements in pitch and yaw without change in speed or staggering outside 10 path to enable safe scanning of environment for obstacles or pivot turn R/L without LOB. MET 05/04/2024  -Patient will improve score on ABC to at least 67% indicating improved subjective balance confidence and reduced risk of falls. NOT MET 05/04/2024           Plan:  Progressive resistance training and dynamic balance challenges.        Total Session Time 40 and Timed code minutes 40  THERAPEUTIC EXERCISE 40 minutes      Otillia Cordone, PT  05/04/2024, 09:30

## 2024-05-09 ENCOUNTER — Ambulatory Visit
Admission: RE | Admit: 2024-05-09 | Discharge: 2024-05-09 | Disposition: A | Discharge status: Still In Hospital | Payer: Self-pay | Source: Ambulatory Visit | Attending: NEUROLOGY | Admitting: NEUROLOGY

## 2024-05-09 ENCOUNTER — Other Ambulatory Visit: Payer: Self-pay

## 2024-05-09 NOTE — PT Treatment (Signed)
 [facility]  Outpatient Physical Therapy  [address]  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  Thinks she may be doing a little better overall. She is having some mild dizziness this morning. She notes continued haziness in field of vision with right eye.  She states it started out as just part of her field of vision but now it is the whole thing.  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       Objective: Re-assessment and treatment as below:        EXERCISE/ACTIVITY NAME REPETITIONS RESISTANCE COMPLETED THIS DOS   Nustep 8 min 4 Y    Standing reciprocal march 45s x 2   Y   Wide BOS body weight squats 10 x 2  Yellow mediball Y   Side stepping with mini-squat 50' x 2   Y   Step ups   Toe taps 10 ea  15 ea 4  4 N  N    Standing hip 3-way 10 ea green n-HEP   Mini-Squats on Airex Foam  15   n   Lateral Step overs 15 ea LE 6 box N   High marching in hallway 1 length   N   STS 10 Holding 4.4# mediball  N-HEP    Tandem stance  Tandem gait 20-30 sec x 3 trials each   4 passes    Foam beam N  N   Airex NBOS EO/EC Multiple trials    N    Airex step overs  10 each  Y    LAQ 2 x 12 3lbs n     hamstring curl  Knee extension 2 x 10  2 x 10 35 lbs  35 lbs Y  Y   Hip abd machine 2 x 10 35 lbs Y   CRM for right  posterior canalithiasis (Epley)     2    n   STS with mediball  10 4.4# Y             DISCONTINUED ACTIVITIES                                             Access Code: [email or web address]  URL: [email or web address]  Date: 04/13/2024  Prepared by: Geni Search     Exercises  - Seated Long Arc Quad  - 1-2 x daily - 7 x weekly - 2-3 sets - 10 reps  - Standing 3-Way Leg Reach with Resistance at Ankles and Counter Support  - 1-2 x daily - 7 x weekly - 2-3 sets - 10 reps  - Sit to  Stand  - 1-2 x daily - 7 x weekly - 2-3 sets - 10 reps        Assessment:  Progressed resistance on hip abduction machine with good response. Added sit to stand from low mat table with mediball with good response.  Tolerated session overall well.         Short-Term Goals: 3 Weeks   - Patient will be independent with progressive HEP to maximize gains from PT. NOT MET 05/04/2024  - Improve score on 5TSTS to at least 12s   indicating improved BLE strength and progress toward reducing fall risk. MET 05/04/2024  - Patient will demonstrate improved functional ability via improved Patient Specific Functional Score to at least 3.5. NOT MET 05/04/2024    Long-Term Goals: 6 Weeks   - Patient will demonstrate improved bilateral LE strength of at least 4+/5 to maximize independence with self-care. MET 05/04/2024    -Demonstrate ability to roll in bed 3/3X without symptoms of vertigo or nystagmus for safe bed mobility. MET 05/04/2024  -Demonstrate ability to ambulate with head movements in pitch and yaw without change in speed or staggering outside 10" path to enable safe scanning of environment for obstacles or pivot turn R/L without LOB. MET 05/04/2024  -Patient will improve score on ABC to at least 67% indicating improved subjective balance confidence and reduced risk of falls. NOT MET 05/04/2024           Plan:  Progressive resistance training and dynamic balance challenges.             Total Session Time 39, Timed code minutes 39, and Untimed code minutes  0  THERAPEUTIC EXERCISE 39 minutes      Neil Errickson, PTA  05/09/2024 11:05

## 2024-05-11 ENCOUNTER — Other Ambulatory Visit: Payer: Self-pay

## 2024-05-11 ENCOUNTER — Ambulatory Visit (HOSPITAL_COMMUNITY)
Admission: RE | Admit: 2024-05-11 | Discharge: 2024-05-11 | Disposition: A | Discharge status: Still In Hospital | Payer: Self-pay | Source: Ambulatory Visit

## 2024-05-11 NOTE — PT Treatment (Signed)
 [facility]  Outpatient Physical Therapy  [address]  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  Her grandchildren may be coming to live with her from [place]  unexpectedly.  She had a doctor's appointment this morning and her blood pressure was really high after hearing this unexpected news. She has not had any dizziness this morning.       Objective: Treatment as noted below:  BP 153/85 HR 69 left UE start of session   End of session BP 163/89        EXERCISE/ACTIVITY NAME REPETITIONS RESISTANCE COMPLETED THIS DOS   Nustep 8 min 4 Y    Standing reciprocal march One minute x 2   Y   Wide BOS body weight squats 10 x 2  Blue mediball Y   Side stepping with mini-squat One minute    Y   Step ups   Toe taps 10 ea  15 ea 4  4 N  N    Standing hip 3-way 10 ea green n-HEP   Mini-Squats on Airex Foam  15   n   Lateral Step overs 15 ea LE 6 box N   High marching in hallway 1 length   N   STS 10 Holding 4.4# mediball  N-HEP    Tandem stance  Tandem gait 20-30 sec x 3 trials each   4 passes    Foam beam N  N   Airex NBOS EO/EC Multiple trials    N    Airex step overs  10 each  Y    LAQ 2 x 12 3lbs n    Hamstring curl  Knee extension 2 x 12  2 x 12 35 lbs  35 lbs Y  Y   Hip abd machine 2 x 10 35 lbs Y   Airex mediball overhead press 10  Red  Y     Airex thoracic rotation hand offs 10 each side Red  Y    SLS airex  3x   Y    CRM for right posterior canalithiasis (Epley)     2    n   STS with mediball  10 Red  Y             DISCONTINUED ACTIVITIES                                             Access Code: [email or web address]  URL: [email or web address]  Date: 04/13/2024  Prepared by: Geni Search     Exercises  - Seated Long Arc Quad  - 1-2 x daily - 7 x weekly - 2-3 sets - 10 reps  - Standing 3-Way Leg Reach  with Resistance at Ankles and Counter Support  - 1-2 x daily - 7 x weekly - 2-3 sets - 10 reps  - Sit to Stand  - 1-2 x daily - 7 x weekly - 2-3 sets - 10 reps        Assessment:  Tolerated session overall well. Blood pressure did slightly increase at end of session. Patient is more challenged with left single limb stance vs right.         Short-Term Goals: 3 Weeks   - Patient will be independent with progressive HEP to maximize gains from PT. NOT MET 05/04/2024  - Improve score on 5TSTS to at least 12s   indicating improved BLE strength and progress toward reducing fall risk. MET 05/04/2024  - Patient will demonstrate improved functional ability via improved Patient Specific Functional Score to at least 3.5. NOT MET 05/04/2024    Long-Term Goals: 6 Weeks   - Patient will demonstrate improved bilateral LE strength of at least 4+/5 to maximize independence with self-care. MET 05/04/2024    -Demonstrate ability to roll in bed 3/3X without symptoms of vertigo or nystagmus for safe bed mobility. MET 05/04/2024  -Demonstrate ability to ambulate with head movements in pitch and yaw without change in speed or staggering outside 10" path to enable safe scanning of environment for obstacles or pivot turn R/L without LOB. MET 05/04/2024  -Patient will improve score on ABC to at least 67% indicating improved subjective balance confidence and reduced risk of falls. NOT MET 05/04/2024           Plan:  Progressive resistance training and dynamic balance challenges.              Total Session Time 40, Timed code minutes 40, and Untimed code minutes 0  THERAPEUTIC EXERCISE 40 minutes      Seneca Hoback, PTA  05/11/2024 11:03

## 2024-05-16 ENCOUNTER — Other Ambulatory Visit: Payer: Self-pay

## 2024-05-16 ENCOUNTER — Ambulatory Visit (HOSPITAL_COMMUNITY)
Admission: RE | Admit: 2024-05-16 | Discharge: 2024-05-16 | Disposition: A | Discharge status: Still In Hospital | Payer: Self-pay | Source: Ambulatory Visit

## 2024-05-16 NOTE — PT Treatment (Signed)
 [facility]  Outpatient Physical Therapy  [address]  [place], [address]  [phone]  (Fax) [phone]    Physical Therapy Treatment Note    Date: 05/16/2024  Patient's Name: Jasmine Bates  Date of Birth: [birth year]  Physical Therapy Visit    Visit #/POC: 10 of up to 12 planned   Authorization: 10 of 12 authorized (07/10/24)  POC Signed?: yes  POC Ends: 05/23/24  Order Ends: open order  Next Progress Note Due: visit 8 or by 05/11/24     Evaluating Physical Therapist: Jhonny Heman, PT, DPT, NCS  PT diagnosis/Reason for Referral: Frequent falls / ataxia   Next Scheduled Physician Appointment: June 13, 2024  Allergies/Contraindications: NA (avoid supine positions)      Subjective: Patient states she is doing well. Denies any issues with her BP or recent falls. Reports she is ready for discharge next week.       Objective: Treatment as noted below:       EXERCISE/ACTIVITY NAME REPETITIONS RESISTANCE COMPLETED THIS DOS   Nustep 8 min 4 Y    Standing reciprocal march 45s   Y   Wide BOS body weight squats 10 x 2  Blue mediball Y   Side stepping with mini-squat One minute    N   Step ups   Toe taps 10 ea  15 ea 4  4 N  N    Standing hip 3-way 10 ea green Y   Mini-Squats on Airex Foam  15   n   Lateral Step overs 15 ea LE 6 box N   High marching in hallway 1 length   N   STS 10 Holding 4.4# mediball  N-HEP    Tandem stance  Tandem gait 20-30 sec x 3 trials each   4 passes    Foam beam N  N   Airex NBOS EO/EC Multiple trials    N    Airex step overs  10 each   N    Airex beam fwd and revo steps 4 trips  Y   LAQ 2 x 12 3lbs n    Hamstring curl  Knee extension 2 x 12  2 x 12 35 lbs  35 lbs N  N   Hip abd machine 2 x 10 35 lbs Y   Airex  beam mediball overhead press 10  Red  Y    Airex beam thoracic rotation hand offs 10 each side Red  N    SLS airex  3x    Y    CRM for right posterior canalithiasis (Epley)     2    n   STS with mediball  10 Blue Y    BOSU ball weight  shifting to find static balance (CGA of PT) 1 min  Y            DISCONTINUED ACTIVITIES                                              Assessment:  Patient demonstrates overall improvement in functional strength and dynamic balance than in previous sessions. She was able to advance to completing exercises on uneven surface while positioned in tandem stance. Patient challenged by session and did require intermittent contact guard assist for steadying. She was not able to achieve static position on BOSU ball despite manual  and verbal cueing for weight shift.     Short-Term Goals: 3 Weeks   - Patient will be independent with progressive HEP to maximize gains from PT. NOT MET 05/04/2024  - Improve score on 5TSTS to at least 12s indicating improved BLE strength and progress toward reducing fall risk. MET 05/04/2024  - Patient will demonstrate improved functional ability via improved Patient Specific Functional Score to at least 3.5. NOT MET 05/04/2024    Long-Term Goals: 6 Weeks   - Patient will demonstrate improved bilateral LE strength of at least 4+/5 to maximize independence with self-care. MET 05/04/2024    -Demonstrate ability to roll in bed 3/3X without symptoms of vertigo or nystagmus for safe bed mobility. MET 05/04/2024  -Demonstrate ability to ambulate with head movements in pitch and yaw without change in speed or staggering outside 10" path to enable safe scanning of environment for obstacles or pivot turn R/L without LOB. MET 05/04/2024  -Patient will improve score on ABC to at least 67% indicating improved subjective balance confidence and reduced risk of falls. NOT MET 05/04/2024           Plan:  2 sessions remain. Review discharge HEP and continue to provide balance challenges.         Total Session Time 35 and Timed code minutes 35  THERAPEUTIC EXERCISE 35 minutes      Kippy Melena, PT  05/16/2024, 09:26

## 2024-05-18 ENCOUNTER — Ambulatory Visit (HOSPITAL_COMMUNITY): Payer: Self-pay

## 2024-05-23 ENCOUNTER — Other Ambulatory Visit: Payer: Self-pay

## 2024-05-23 ENCOUNTER — Ambulatory Visit
Admission: RE | Admit: 2024-05-23 | Discharge: 2024-05-23 | Disposition: A | Discharge status: Still In Hospital | Payer: Self-pay | Source: Ambulatory Visit | Attending: NEUROLOGY | Admitting: NEUROLOGY

## 2024-05-23 NOTE — PT Treatment (Addendum)
 [facility]  Outpatient Physical Therapy  [address]  [place], [address]  (Office) [phone]  (Fax) [phone]    PT DISCHARGE SUMMARY:     Patient seen for 11 of up to 12 planned visits to work on improving balance and reducing fall risk. Good progress made throughout rehab course with 6 of 7 goals met. Patient discharged this date. Please see note below for last recorded functional status.     Jhonny Heman, PT, DPT, NCS     06/07/2024 14:55        Physical Therapy Treatment Note    Date: 05/23/2024  Patient's Name: Jasmine Bates  Date of Birth: 1956-03-09  Physical Therapy Visit    Visit #/POC: 11 of up to 12 planned   Authorization: 11 of 12 authorized (07/10/24)  POC Signed?: yes  POC Ends: 05/23/24  Order Ends: open order  Next Progress Note Due: visit 8 or by 05/11/24     Evaluating Physical Therapist: Jhonny Heman, PT, DPT, NCS  PT diagnosis/Reason for Referral: Frequent falls / ataxia   Next Scheduled Physician Appointment: June 13, 2024  Allergies/Contraindications: NA (avoid supine positions)      Subjective:Patient states she is doing well and feels as though she can be (I) at this point. Did state that she will experience dizziness still from her BPPV.       Objective: Treatment as noted below: Review of HEP and also added/gave additional gaze/VOR exercises for patient to perform at home.        EXERCISE/ACTIVITY NAME REPETITIONS RESISTANCE COMPLETED THIS DOS   Nustep 8 min 4 Y    Standing reciprocal march 45s   Y   Wide BOS body weight squats 10 x 2  Blue mediball Y   Side stepping with mini-squat One minute    N   Step ups   Toe taps 10 ea  15 ea 4  4 N  N    Standing hip 3-way 10 ea green Y   Mini-Squats on Airex Foam  15   n   Lateral Step overs 15 ea LE 6 box N   High marching in hallway 1 length   N   STS 10 Holding 4.4# mediball  N-HEP    Tandem stance  Tandem gait 20-30 sec x 3 trials each   4 passes    Foam beam N  N   Airex NBOS EO/EC  Multiple trials    N    Airex step overs  10 each   N    Airex beam fwd and revo steps 4 trips   Y   LAQ 2 x 12 3lbs n    Hamstring curl  Knee extension 2 x 12  2 x 12 35 lbs  35 lbs N  N   Hip abd machine 2 x 10 35 lbs Y   Airex  beam mediball overhead press 10  Red  Y    Airex beam thoracic rotation hand offs 10 each side Red  N    SLS airex  3x    Y    CRM for right posterior canalithiasis (Epley)     2    n   STS with mediball  10 Blue Y    BOSU ball weight shifting to find static balance (CGA of PT) 1 min   Y            DISCONTINUED ACTIVITIES  Access Code: [email or web address]  URL: [email or web address]  Date: 05/23/2024  Prepared by: Geni Search    Exercises  - Seated Gaze Stabilization with Head Rotation  - 1 x daily - 7 x weekly - 1-2 sets - 1 reps  - Seated Vertical Smooth Pursuit  - 1 x daily - 7 x weekly - 1-2 sets - 1 reps  - Standing Gaze Stabilization with Two Near Targets and Head Rotation  - 1 x daily - 7 x weekly - 1-2 sets - 1 reps  - Walking with Head Rotation  - 1 x daily - 7 x weekly - 1-2 sets - 1 reps    Assessment:  Good understanding of HEP and patient education. Patient has met most PT goals and has progressed with POC.     Short-Term Goals: 3 Weeks   - Patient will be independent with progressive HEP to maximize gains from PT.  MET 05/04/2024  - Improve score on 5TSTS to at least 12s indicating improved BLE strength and progress toward reducing fall risk. MET 05/04/2024  - Patient will demonstrate improved functional ability via improved Patient Specific Functional Score to at least 3.5.  MET 05/04/2024    Long-Term Goals: 6 Weeks   - Patient will demonstrate improved bilateral LE strength of at least 4+/5 to maximize independence with self-care. MET 05/04/2024    -Demonstrate ability to roll in bed 3/3X without symptoms of vertigo or nystagmus for safe bed mobility. MET 05/04/2024  -Demonstrate ability to ambulate with head movements in pitch and yaw  without change in speed or staggering outside 10" path to enable safe scanning of environment for obstacles or pivot turn R/L without LOB. MET 05/04/2024  -Patient will improve score on ABC to at least 67% indicating improved subjective balance confidence and reduced risk of falls. NOT MET 05/04/2024           Plan: See PT note for DC Summary.       Total Session Time 35, Timed code minutes 35, and Untimed code minutes 0  THERAPEUTIC EXERCISE 35 minutes      Geni Search, PTA  05/23/2024, 11:11

## 2024-06-05 ENCOUNTER — Other Ambulatory Visit (INDEPENDENT_AMBULATORY_CARE_PROVIDER_SITE_OTHER): Payer: Self-pay | Admitting: NEUROLOGY

## 2024-06-13 ENCOUNTER — Ambulatory Visit (INDEPENDENT_AMBULATORY_CARE_PROVIDER_SITE_OTHER): Payer: Self-pay | Admitting: NEUROLOGY

## 2024-06-13 ENCOUNTER — Encounter (INDEPENDENT_AMBULATORY_CARE_PROVIDER_SITE_OTHER): Payer: Self-pay | Admitting: NEUROLOGY

## 2024-06-13 ENCOUNTER — Other Ambulatory Visit: Payer: Self-pay

## 2024-06-13 VITALS — BP 110/62 | HR 77 | Temp 96.9°F

## 2024-06-13 DIAGNOSIS — R296 Repeated falls: Secondary | ICD-10-CM

## 2024-06-13 DIAGNOSIS — G43009 Migraine without aura, not intractable, without status migrainosus: Secondary | ICD-10-CM

## 2024-06-13 MED ORDER — VERAPAMIL ER (PM) 300 MG CAPSULE 24HR PELLET CT,EXT.RELEASE
300.0000 mg | EXTENDED_RELEASE_CAPSULE | Freq: Every evening | ORAL | 1 refills | Status: AC
Start: 2024-06-13 — End: 2024-12-10

## 2024-06-13 NOTE — Progress Notes (Signed)
 Assessment & Plan  Falls frequently  She is a 68 year old woman who follows-up for ataxia with frequent falls. Descriptions of her falls are not very detailed though she denies any presyncopal feelings when this happens. Her exam is largely intact though there is some mild to moderate large fiber sensory loss in the lower extremities. This would typically be concerning for polyneuropathy given longstanding diabetes, however, she is generally hyperreflexic. Her brain MRI shows significant white matter disease with some confluent areas concerning for possible demyelinating changes. She reports being previously worked up for multiple sclerosis about 15 years ago including a lumbar puncture that was unremarkable. I would like to obtain prior MRIs for comparison. MRI of the cervical spine and repeat brain MRI were both reassuring. EMG showed no significant neuropathy. She does report history of inner ear issues so this may have been the driver of symptoms. She has found physical therapy helpful and has had no recent falls.     - no further neurologic workup at this time  Migraine without aura  She reports longstanding history of migraines. Typically frontal or temporal pain associated with photo/phonophobia lasting hours at a time. These improved significantly with addition of verapamil . Verapamil  was previously helpful for control though she has had recent worsening following cataract surgery. She is having near daily headaches (>15 headache days per month). She has also been on amitriptyline in the past which was no effective. I would like to get her started on Botox for migraine to avoid further polypharmacy.    - start Botox for migraine  - decrease verapamil  to previous dose of 300 mg 24h QHS  - continue rizatriptan  10 mg ODT PRN  - Encouraged aggressive hydration  - counseled on medication overuse headache    Thank you for allowing me to participate in your patient's care and please do not hesitate to contact me  for any questions or concerns.    S. Zach Larenzo Caples, DO  Assistant Professor of Neurology  [place]  [facility]     I personally spent a total of 40 minutes today preparing to see the patient, in the encounter with the patient, and documenting after the visit.    Jasmine Bates and/or [facility] provider will continue to be the provider focal point in managing the chronic complex neurological condition  ==========================================================================================================================================    NAME:  Jasmine Bates  DOB:  1956-05-09  VISIT DATE:  11/02/2023    CC:  Falls    Patient seen in consultation at the request of Jon Molly, FNP  History obtained from the patient and chart/records  Age of patient:  68 y.o.    INTERVAL: Since last visit, she reports good improvement in her balance with physical therapy. She has had no recent falls and is doing well with her gait. Her main concern today is worsening of headaches. She reports that following cataract surgery, she began having near daily headaches. Maxalt  is generally effective. Husband reports she is not drinking enough water. Increase in verapamil  dose showed no effect.     HPI:   I had the pleasure of seeing your patient in neurology clinic for an outpatient consultation, who is a 68 y.o. year old female who was referred for evaluation of falls.  Please allow me to summarize the history for the record.    She is joined by husband who provides additional history. She has been struggling with gait instability for 2-3 years at this point. She reports  rather frequent falls without clear trigger. She has difficulty describing the mechanics of her falls but she denies significant dizziness or presyncope prior to falling. She does feel that sometimes her legs give out or don't support her. She denies any numbness in the lower extremities. She occasionally will have some shooting pains in the  feet. She has had diabetes for 15 years or more and has only been controlled in the last 2-3 years. Has had A1c's of 13 in the past. She reports prior abnormal brain MRIs. She reports undergoing a lumbar puncture for multiple sclerosis which was negative. She denies any history of prior focal neurologic deficits including visual disturbance.   She also reports history of migraines. This has been present for many years. They were frequent and severe until being placed on verapamil  which helped significantly. They have increased in frequency. She is currently having 1 per week. She has trialed oral sumatriptan  with little effect. Has had better but not complete response to nasal sumatriptan .       ============================================================================================================================================  PMHx  Patient Active Problem List   Diagnosis    Malignant neoplasm of breast    Dizziness    Hearing loss    Falls frequently    Osteopenia     Past Surgical History:   Procedure Laterality Date    CESAREAN SECTION      HX BREAST LUMPECTOMY Right     10/2018 positive  pt had radiation    HX GASTRIC SLEEVE           Family Medical History:       Problem Relation (Age of Onset)    Breast Cancer Other    Hypertension (High Blood Pressure) Father    Lung Cancer Mother    Melanoma Mother    No Known Problems Sister, Brother, Maternal Aunt, Maternal Uncle, Paternal Aunt, Paternal Uncle, Maternal Grandmother, Maternal Grandfather, Paternal Grandmother, Paternal Grandfather, Daughter, Son            Current Outpatient Medications   Medication Sig Dispense Refill    albuterol sulfate (PROVENTIL OR VENTOLIN OR PROAIR) 90 mcg/actuation Inhalation oral inhaler Take 1-2 Puffs by inhalation      alendronate  (FOSAMAX ) 70 mg Oral Tablet TAKE 1 TABLET (70 MG TOTAL) BY MOUTH EVERY 7 DAYS 4 Tablet 0    atorvastatin (LIPITOR) 80 mg Oral Tablet Take 1 Tablet (80 mg total) by mouth Daily      BD UF NANO PEN  NEEDLE 32 gauge x 5/32 Needle USE AS DIRECTED WITH INSULIN PEN      biotin 1 mg Oral Capsule Take by mouth      bisacodyL (DULCOLAX) 5 mg Oral Tablet, Delayed Release (E.C.) Take 1 Tablet (5 mg total) by mouth      cetirizine (ZYRTEC) 10 mg Oral Tablet Take 1 Tablet (10 mg total) by mouth Daily      Cholecalciferol, Vitamin D3, 50 mcg (2,000 unit) Oral Capsule Take 1 Capsule by mouth      cranberry fruit extract (CRANBERRY POWDER ORAL) Take 1 Tablet by mouth      ergocalciferol, vitamin D2, (DRISDOL) 1,250 mcg (50,000 unit) Oral Capsule       FREESTYLE LIBRE 3 SENSOR Does not apply Device APPLY EVERY 14 DAYS      glimepiride (AMARYL) 4 mg Oral Tablet Take 1 Tablet (4 mg total) by mouth Twice daily      letrozole  (FEMARA ) 2.5 mg Oral Tablet TAKE 1 TABLET BY MOUTH EVERY DAY 90 Tablet  1    levothyroxine (SYNTHROID) 100 mcg Oral Tablet       lisinopriL (PRINIVIL) 20 mg Oral Tablet Take 1 Tablet (20 mg total) by mouth Daily      metFORMIN (GLUCOPHAGE XR) 500 mg Oral Tablet Sustained Release 24 hr TAKE 2 TABLETS BY MOUTH 2 TIMES DAILY (WITH MEALS)      methocarbamoL  (ROBAXIN ) 500 mg Oral Tablet Take 2 Tablets (1,000 mg total) by mouth Every 6 hours as needed (MUSCLE SPASM) 40 Tablet 0    montelukast (SINGULAIR) 10 mg Oral Tablet       MYRBETRIQ 50 mg Oral Tablet Sustained Release 24 hr Take 1 Tablet (50 mg total) by mouth Daily      NOVOLIN N FLEXPEN 100 unit/mL (3 mL) Subcutaneous Insulin Pen subcutaneous pen       ondansetron (ZOFRAN ODT) 4 mg Oral Tablet, Rapid Dissolve       oxyBUTYnin (DITROPAN XL) 5 mg Oral Tablet Extended Rel 24 hr Take 1 Tablet (5 mg total) by mouth Daily      OZEMPIC 2 mg/dose (8 mg/3 mL) Subcutaneous Pen Injector Inject 2 mg under the skin Every 7 days      pantoprazole (PROTONIX) 40 mg Oral Tablet, Delayed Release (E.C.)       sertraline (ZOLOFT) 100 mg Oral Tablet Take 1 Tablet (100 mg total) by mouth Daily      Sumatriptan  20 mg/actuation Nasal Spray, Non-Aerosol Administer 1 Spray into  affected nostril(s) Once, as needed for Migraine for up to 90 days 14 Each 2    verapamiL  (VERELAN  PM) 300 mg Oral Capsule, 24hr ER Pellet CT Take 1 Capsule (300 mg total) by mouth Every night 90 Capsule 1     No current facility-administered medications for this visit.     Allergies   Allergen Reactions    Nsaids (Non-Steroidal Anti-Inflammatory Drug) Anaphylaxis    Sulfamethoxazole-Trimethoprim Angioedema and Rash    Aspirin     Sulfa (Sulfonamides)      Social History     Socioeconomic History    Marital status: Married     Spouse name: Not on file    Number of children: Not on file    Years of education: Not on file    Highest education level: Not on file   Occupational History    Not on file   Tobacco Use    Smoking status: Never    Smokeless tobacco: Never   Vaping Use    Vaping status: Never Used   Substance and Sexual Activity    Alcohol use: Never    Drug use: Never    Sexual activity: Not on file   Other Topics Concern    Not on file   Social History Narrative    Not on file     Social Determinants of Health     Financial Resource Strain: Not on file   Transportation Needs: Not on file   Social Connections: Not on file   Intimate Partner Violence: Not on file   Housing Stability: Not on file       ============================================================================================================================================  GENERAL EXAMINATION  BP 110/62 (Site: Left Arm, Patient Position: Sitting)   Pulse 77   Temp 36.1 C (96.9 F) (Temporal)   SpO2 96%     Vital signs personally reviewed  General: No acute distress, alert  HEENT: Normocephalic, no scleral icterus  Extremities: No significant edema, No cyanosis    NEUROLOGIC EXAM  On neurological exam, patient was awake, alert  and answering questions appropriately  Speech was fluent, without dysarthria or aphasia.    CN  II: not directly tested, grossly intact  III, IV, VI: extraocular movements intact without nystagmus  V: intact to light  touch  VII: face symmetric without weakness  VIII: grossly intact  IX, X: symmetric palatal elevation  XI: normal strength of trapezius and sternocleidomastoid bilaterally  XII: tongue midline with full movements    MOTOR  Bulk: normal  Abnormal Movements: none    Strength:   MRC Grading Scale   Right Left   Deltoid 5 5   Biceps 5 5   Triceps 5 5   Wrist Extension - -   Wrist Flexion - -   Finger Extension - -   Finger Abduction - -   Finger Flexion - -   Hip Flexion 5 5   Hip Extension - -   Hip Abduction - -   Hip Adduction - -   Knee Extension 5 5   Knee Flexion 5 5   Ankle Dorsiflexion - -   Ankle Plantarflexion - -   Toe Extension - -   Toe Flexion - -     REFLEXES   Right Left   Biceps 3 3   Triceps - -   Brachioradialis 2 2   Patellar 3 3   Achilles - -   Plantar - -   Hoffman Positive positive   Pectoralis - -   Jaw Jerk - -       SENSORY  Light touch: intact throughout    GAIT  General: much more steady today    COORDINATION  Finger nose finger: WNL     ================================================================================================================================LABS  Personal Review of prior labs is notable for:   2024  A1c 7.0 (high 10.3)  TSH WNL   IMAGING  Personal Review of imaging is notable for:   MRI Brain October 2025 - stable appearance of prior white matter lesions    MRA H&N October 2025 - no significant intra or extracranial stenosis  MRI Brain w/wo June 2024 - severe white matter ds, many juxtacortical, some confluent areas    OTHER DIAGNOSTICS  Personal Review of other prior diagnostics is notable for: not applicable

## 2024-06-13 NOTE — Assessment & Plan Note (Addendum)
 She is a 68 year old woman who follows-up for ataxia with frequent falls. Descriptions of her falls are not very detailed though she denies any presyncopal feelings when this happens. Her exam is largely intact though there is some mild to moderate large fiber sensory loss in the lower extremities. This would typically be concerning for polyneuropathy given longstanding diabetes, however, she is generally hyperreflexic. Her brain MRI shows significant white matter disease with some confluent areas concerning for possible demyelinating changes. She reports being previously worked up for multiple sclerosis about 15 years ago including a lumbar puncture that was unremarkable. I would like to obtain prior MRIs for comparison. MRI of the cervical spine and repeat brain MRI were both reassuring. EMG showed no significant neuropathy. She does report history of inner ear issues so this may have been the driver of symptoms. She has found physical therapy helpful and has had no recent falls.     - no further neurologic workup at this time

## 2024-06-21 ENCOUNTER — Telehealth (INDEPENDENT_AMBULATORY_CARE_PROVIDER_SITE_OTHER): Payer: Self-pay | Admitting: NEUROLOGY

## 2024-06-21 NOTE — Telephone Encounter (Signed)
 Called patient and left her know that the Botox was denied by the insurance. Dr. Sherre said she can try either Topamax or Effexor since those are preferred agents from her insurance. No answer on patient's phone, left a voicemail explaining. Asked her to call back and let us  know if she wants to try one of those 2 medications.

## 2024-09-02 ENCOUNTER — Other Ambulatory Visit (INDEPENDENT_AMBULATORY_CARE_PROVIDER_SITE_OTHER): Payer: Self-pay | Admitting: NEUROLOGY

## 2024-09-04 ENCOUNTER — Other Ambulatory Visit (INDEPENDENT_AMBULATORY_CARE_PROVIDER_SITE_OTHER): Payer: Self-pay | Admitting: NEUROLOGY

## 2024-09-04 NOTE — Telephone Encounter (Signed)
 Dose adjustment

## 2024-10-16 ENCOUNTER — Encounter (INDEPENDENT_AMBULATORY_CARE_PROVIDER_SITE_OTHER): Payer: Self-pay | Admitting: NEUROLOGY

## 2024-11-27 ENCOUNTER — Ambulatory Visit (HOSPITAL_COMMUNITY): Payer: Self-pay

## 2025-01-04 ENCOUNTER — Ambulatory Visit (INDEPENDENT_AMBULATORY_CARE_PROVIDER_SITE_OTHER): Payer: Self-pay | Admitting: NURSE PRACTITIONER
